# Patient Record
Sex: Male | Born: 2000 | Race: Black or African American | Hispanic: No | Marital: Single | Smoking: Current every day smoker
Health system: Southern US, Community
[De-identification: ages and names within clinical notes are randomized; demographics above are authoritative.]

## PROBLEM LIST (undated history)

## (undated) DIAGNOSIS — L509 Urticaria, unspecified: Secondary | ICD-10-CM

## (undated) HISTORY — PX: HERNIA REPAIR: SHX51

## (undated) HISTORY — DX: Urticaria, unspecified: L50.9

---
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
## 2018-04-21 ENCOUNTER — Ambulatory Visit (INDEPENDENT_AMBULATORY_CARE_PROVIDER_SITE_OTHER): Payer: Medicaid Other | Admitting: Pediatrics

## 2018-04-21 DIAGNOSIS — R109 Unspecified abdominal pain: Secondary | ICD-10-CM | POA: Diagnosis not present

## 2018-04-21 DIAGNOSIS — Z7251 High risk heterosexual behavior: Secondary | ICD-10-CM | POA: Diagnosis not present

## 2018-04-21 LAB — POCT URINALYSIS DIPSTICK
Bilirubin, UA: NEGATIVE
Blood, UA: NEGATIVE
GLUCOSE UA: NEGATIVE
Ketones, UA: POSITIVE
Nitrite, UA: NEGATIVE
Protein, UA: POSITIVE — AB
Spec Grav, UA: 1.015 (ref 1.010–1.025)
Urobilinogen, UA: NEGATIVE E.U./dL — AB
pH, UA: 7 (ref 5.0–8.0)

## 2018-04-21 NOTE — Progress Notes (Signed)
Subjective:     Patient ID: Brendan Miller, male   DOB: [birth year], 17 y.o.   MRN: [id number]  HPI  The patient is here alone for concern about having STIs. The patient states that he wants to make sure his occasional abdominal pain is not from a STI. He states that he last had sex a few weeks ago. He denies any dysuria or discharge from his penis. He states that he has had random episodes of lower abdominal pain, usually in the morning, but, it does not last for a long time.  The patient states that regarding his weight loss, he is eating a significantly less fast foods and drink less sugary drinks, more water.   Review of Systems .Review of Symptoms: General ROS: negative for - chills, fatigue, fever, night sweats ENT ROS: negative for - headaches or sore throat Respiratory ROS: no cough, shortness of breath, or wheezing Gastrointestinal ROS: negative for - change in stools, diarrhea or nausea/vomiting     Objective:   Physical Exam BP 116/72   Temp 98.4 F (36.9 C) (Temporal)   Wt 149 lb (67.6 kg)   General Appearance:  Alert, cooperative, no distress, appropriate for age                            Head:  Normocephalic, no obvious abnormality                             Eyes:  PERRL, EOM's intact, conjunctiva and corneas clear, fundi benign, both eyes                             Nose:  Nares symmetrical, septum midline, mucosa pink, clear watery discharge; no sinus tenderness                          Throat:  Lips, tongue, and mucosa are moist, pink, and intact; teeth intact                            Lungs:  Clear to auscultation bilaterally, respirations unlabored                             Heart:  Normal PMI, regular rate & rhythm, S1 and S2 normal, no murmurs, rubs, or gallops                     Abdomen:  Soft, non-tender, bowel sounds active all four quadrants, no mass, or organomegaly              Genitourinary:  Normal male, testes descended, no discharge, swelling, or  pain                   Skin/Hair/Nails:  Skin warm, dry, and intact, no rashes or abnormal dyspigmentation                      Assessment:     Abdominal pain     Plan:      .1. Abdominal pain, unspecified abdominal location - POCT urinalysis dipstick -   Ref Range & Units 11:37  Color, UA  yellow   Clarity, UA  clear   Glucose, UA  Negative Negative   Bilirubin, UA  neg   Ketones, UA  positive   Spec Grav, UA 1.010 - 1.025 1.015   Blood, UA  neg   pH, UA 5.0 - 8.0 7.0   Protein, UA Negative PositiveAbnormal    Urobilinogen, UA 0.2 or 1.0 E.U./dL negativeAbnormal    Nitrite, UA  neg   Leukocytes, UA Negative Small (1+)Abnormal    Appearance       2. High risk sexual behavior, unspecified type - GC/Chlamydia Probe Amp([facility]) - HIV antibody (with reflex); Future - HIV antibody (with reflex)    [phone] (patient's cell phone)    RTC for yearly WCC in 2 months

## 2018-04-22 LAB — GC/CHLAMYDIA PROBE AMP
CHLAMYDIA, DNA PROBE: NEGATIVE
Neisseria gonorrhoeae by PCR: NEGATIVE

## 2018-04-22 LAB — HIV ANTIBODY (ROUTINE TESTING W REFLEX): HIV Screen 4th Generation wRfx: NONREACTIVE

## 2018-04-24 ENCOUNTER — Telehealth: Payer: Self-pay | Admitting: Pediatrics

## 2018-04-24 NOTE — Telephone Encounter (Signed)
Spoke with pt, voices understanding 

## 2018-04-24 NOTE — Telephone Encounter (Signed)
Please call Brendan Miller and let him know that test results from his urine and blood are all normal.   Thank you !

## 2018-05-01 ENCOUNTER — Encounter: Payer: Self-pay | Admitting: Pediatrics

## 2018-12-15 ENCOUNTER — Telehealth: Payer: Self-pay

## 2018-12-15 ENCOUNTER — Ambulatory Visit: Payer: Medicaid Other

## 2018-12-15 NOTE — Telephone Encounter (Signed)
Called dad to let him know of pt missed apt. Dad states son wanted to cancel and will call us to reschedule. 1st no show.

## 2019-01-06 ENCOUNTER — Ambulatory Visit (INDEPENDENT_AMBULATORY_CARE_PROVIDER_SITE_OTHER): Payer: Medicaid Other | Admitting: Pediatrics

## 2019-01-06 ENCOUNTER — Other Ambulatory Visit: Payer: Self-pay

## 2019-01-06 DIAGNOSIS — Z202 Contact with and (suspected) exposure to infections with a predominantly sexual mode of transmission: Secondary | ICD-10-CM | POA: Diagnosis not present

## 2019-01-06 DIAGNOSIS — R590 Localized enlarged lymph nodes: Secondary | ICD-10-CM | POA: Diagnosis not present

## 2019-01-06 DIAGNOSIS — J029 Acute pharyngitis, unspecified: Secondary | ICD-10-CM | POA: Diagnosis not present

## 2019-01-06 LAB — POCT RAPID STREP A (OFFICE): Rapid Strep A Screen: NEGATIVE

## 2019-01-06 MED ORDER — AZITHROMYCIN 250 MG PO TABS
1000.0000 mg | ORAL_TABLET | Freq: Once | ORAL | Status: AC
  Administered 2019-01-06: 1000 mg via ORAL

## 2019-01-06 MED ORDER — AZITHROMYCIN 1 G PO PACK: 1.0000 g | PACK | Freq: Once | ORAL | Status: DC

## 2019-01-06 MED ORDER — CEFTRIAXONE SODIUM 250 MG IJ SOLR
250.0000 mg | Freq: Once | INTRAMUSCULAR | Status: AC
  Administered 2019-01-06: 250 mg via INTRAMUSCULAR

## 2019-01-06 NOTE — Progress Notes (Signed)
Brendan Miller is here today with complaint of a swollen lymph node on the left side of his neck that is getting smaller. He's had no cough, no runny nose, no congestion, ,no sore throat but his neck was sore in the area of the node. No rashes, no burning with urine, no back pain, no ankle/knee pain. His vision has not changed. He is sexually active and does not always use condoms. He has not had oral sex in 6 months.     No distress, healthy male  Left anterior lymphadenopathy no overlying erythema, non tender, and mobile.  No pharyngeal erythema, mildly enlarged tonsils. Single tonsilith on the left side.  Lungs clear Heart sounds normal, RRR   18 yo with unsafe sexual practices and enlarged cervical lymph node.  Send GC/Chlamydia, HIV, and RPR Give ceftriaxone 250 mg IM  Azithromycin 1 gm oral here today  Follow up as needed

## 2019-01-07 ENCOUNTER — Telehealth: Payer: Self-pay

## 2019-01-07 LAB — GC/CHLAMYDIA PROBE AMP
Chlamydia trachomatis, NAA: NEGATIVE
Neisseria gonorrhoeae by PCR: NEGATIVE

## 2019-01-07 LAB — SPECIMEN STATUS REPORT

## 2019-01-07 LAB — HIV-1 RNA QUANT-NO REFLEX-BLD

## 2019-01-07 LAB — RPR: RPR Ser Ql: NONREACTIVE

## 2019-01-07 NOTE — Telephone Encounter (Signed)
[phone] to call Darreld Mclean from [facility] need a call Back about the labs that was sent in wanted to know what order we needed to be done 1 or 2 because its showing a frozen plasma and it was  HIV 1 RNA viral.

## 2019-01-07 NOTE — Telephone Encounter (Signed)
Look in the lab book and call them back and tell them you just want to run the regular HIV testing. The code is in the book dude,.

## 2019-01-07 NOTE — Telephone Encounter (Signed)
So as  I was looking at book a lot of the HIV testing are saying frozen. I found one that doesn't say frozen which is called HIV-1, quallitative, RNA  ([id number]). Does that sound about right

## 2019-01-07 NOTE — Telephone Encounter (Signed)
Called Trina from [facility], gave her the correct order. Apologized for the confusion. She will add lab to the order and will also fax in something.

## 2019-01-07 NOTE — Telephone Encounter (Signed)
Yes...have them confirm. I never send in the frozen but I have a tendency to order the wrong one! Sorry about that.

## 2019-01-09 LAB — CULTURE, GROUP A STREP

## 2019-01-30 LAB — SPECIMEN STATUS REPORT

## 2019-01-30 LAB — HIV-1 RNA, QUALITATIVE, TMA: HIV-1 RNA Qualitative: NEGATIVE

## 2019-07-20 ENCOUNTER — Ambulatory Visit: Payer: Medicaid Other

## 2019-07-21 ENCOUNTER — Ambulatory Visit: Payer: Medicaid Other

## 2019-07-22 ENCOUNTER — Ambulatory Visit (INDEPENDENT_AMBULATORY_CARE_PROVIDER_SITE_OTHER): Payer: Medicaid Other | Admitting: Pediatrics

## 2019-07-22 ENCOUNTER — Other Ambulatory Visit: Payer: Self-pay

## 2019-07-22 VITALS — Temp 97.8°F | Wt 171.4 lb

## 2019-07-22 DIAGNOSIS — I889 Nonspecific lymphadenitis, unspecified: Secondary | ICD-10-CM

## 2019-07-22 DIAGNOSIS — K9041 Non-celiac gluten sensitivity: Secondary | ICD-10-CM | POA: Diagnosis not present

## 2019-07-22 MED ORDER — AMOXICILLIN-POT CLAVULANATE 875-125 MG PO TABS: 1.0000 | ORAL_TABLET | Freq: Two times a day (BID) | ORAL | 0 refills | Status: AC

## 2019-07-22 MED ORDER — AMOXICILLIN-POT CLAVULANATE 875-125 MG PO TABS: 1.0000 | ORAL_TABLET | Freq: Two times a day (BID) | ORAL | 0 refills | Status: DC

## 2019-07-22 NOTE — Patient Instructions (Signed)
Low-Gluten Eating Plan Gluten is a protein that is found in wheat, barley, rye, and triticale, a hybrid of wheat and rye. Some people have a condition that makes them unable to digest gluten. For those people, eating just a small amount of gluten can damage their intestines. This is not a gluten-free eating plan. This low-gluten eating plan is for people who feel better when they eat less gluten. What are tips for following this plan? Reading food labels Make sure to read food labels. Look for wheat, rye, barley, oats (unless it says "certified gluten-free"), malt, and brewer's yeast. If the food contains any of these, it has gluten in it. Wheat-free does not mean gluten-free. Meal planning Eat a variety of foods so you get all of the nutrients that you need. To have more control over the ingredients in your meals, consider making food yourself instead of buying prepared foods. General information Many gluten-free grain products are not fortified with vitamins and minerals like gluten-containing grains are. Because of this, there is a risk of nutrient deficiencies if you do not eat a balanced diet. Make sure to meet with your health care provider or a registered dietitian to review your diet. When eating out, look for restaurants that have gluten-free options or can make substitutions to accommodate this eating plan. Consider calling a restaurant ahead of time to discuss their menu. What foods can I eat?  With this eating plan, you can eat anything that is labeled "gluten-free" or that does not contain wheat or other grains that have gluten. Fruits All fruits, such as bananas, apples, oranges, grapes, papaya, mango, pomegranate, kiwi, grapefruit, and cherries. Vegetables All vegetables that are not in a sauce that would contain gluten, such as lettuce, spinach, peas, beets, cauliflower, cabbage, broccoli, carrots, tomatoes, squash, eggplant, herbs, peppers, onions, cucumbers, Brussels sprouts,  yams, and sweet potatoes. Grains Naturally gluten-free grains and flours, including rice, bulgur, quinoa, corn, buckwheat, cassava, amaranth, millet, polenta or cornmeal, tapioca, flax, chia, yucca, sorghum, and teff. Corn tortillas or taco shells. Oatmeal that is labeled as "gluten-free" or "uncontaminated." Nut flours. Meats and other proteins Beef. Pork. Chicken. [place]key. Fish. Eggs. Tofu. Beans. Nuts. Lentils. Dairy Milk. Ice cream. Yogurt. Cheese. Cottage cheese. Beverages Water. Coffee. Tea. Juice. Soda. Seltzer water. Distilled alcohols. Wine. Seasonings and condiments Mustard. Relish. Ketchup. Barbecue sauce. Vinegar. Mayonnaise. Tamari. Sweets and desserts Honey. Sugar. Maple syrup. Fats and oils Butter. Vegetable oil. Olive oil. Canola oil. Walnut oil. Other foods Arrowroot or cornstarch. Potato flour. The items listed above may not be a complete list of foods and beverages you can eat. Contact a dietitian for more information. What foods should I avoid? Grains Wheat. Barley. Rye. Oatmeal that is not certified gluten-free. Triticale. Meats and other proteins Seitan. Precooked or cured meat, such as sausages or meat loaves. Hot dogs. Salami. Beverages Beer, ale, lager, and malt beverages made from gluten-containing grains. Seasonings and condiments Malt vinegar. Salad dressing. Soy sauce. Teriyaki sauce. Marinades. Check the label of any pre-made sauces for a full list of ingredients. Sweets and desserts Licorice. Brown rice syrup. Pre-made pudding or pudding mixes. Other foods Bouillon cubes. Canned or boxed pre-made soups or soup packets. Bagged chips, such as potato chips and tortilla chips. Seasoning packets. Energy bars. Seasoned rice mixes. Processed foods. The items listed above may not be a complete list of foods and beverages to avoid. Contact a dietitian for more information. Summary Gluten is a protein that is found in wheat, barley, rye, and triticale,  a hybrid of  wheat and rye. This low-gluten eating plan is for people who feel better when they eat less gluten. Reading food labels of packaged foods is the best way to make sure you are following a low-gluten eating plan. Look for "gluten-free" on the label. Eat a variety of foods and colors and use whole grains that are naturally gluten-free to reduce your risk of having any nutrient deficiencies. This information is not intended to replace advice given to you by your health care provider. Make sure you discuss any questions you have with your health care provider. Document Released: 02/28/2015 Document Revised: 06/17/2018 Document Reviewed: 06/17/2018 Elsevier Patient Education  2020 [employer]. Lymphadenopathy  Lymphadenopathy means that your lymph glands are swollen or larger than normal (enlarged). Lymph glands, also called lymph nodes, are collections of tissue that filter bacteria, viruses, and waste from your bloodstream. They are part of your body's disease-fighting system (immune system), which protects your body from germs. There may be different causes of lymphadenopathy, depending on where it is in your body. Some types go away on their own. Lymphadenopathy can occur anywhere that you have lymph glands, including these areas:  Neck (cervical lymphadenopathy).  Chest (mediastinal lymphadenopathy).  Lungs (hilar lymphadenopathy).  Underarms (axillary lymphadenopathy).  Groin (inguinal lymphadenopathy). When your immune system responds to germs, infection-fighting cells and fluid build up in your lymph glands. This causes some swelling and enlargement. If the lymph glands do not go back to normal after you have an infection or disease, your health care provider may do tests. These tests help to monitor your condition and find the reason why the glands are still swollen and enlarged. Follow these instructions at home:  Get plenty of rest.  Take over-the-counter and prescription medicines  only as told by your health care provider. Your health care provider may recommend over-the-counter medicines for pain.  If directed, apply heat to swollen lymph glands as often as told by your health care provider. Use the heat source that your health care provider recommends, such as a moist heat pack or a heating pad. ? Place a towel between your skin and the heat source. ? Leave the heat on for 20-30 minutes. ? Remove the heat if your skin turns bright red. This is especially important if you are unable to feel pain, heat, or cold. You may have a greater risk of getting burned.  Check your affected lymph glands every day for changes. Check other lymph gland areas as told by your health care provider. Check for changes such as: ? More swelling. ? Sudden increase in size. ? Redness or pain. ? Hardness.  Keep all follow-up visits as told by your health care provider. This is important. Contact a health care provider if you have:  Swelling that gets worse or spreads to other areas.  Problems with breathing.  Lymph glands that: ? Are still swollen after 2 weeks. ? Have suddenly gotten bigger. ? Are red, painful, or hard.  A fever or chills.  Fatigue.  A sore throat.  Pain in your abdomen.  Weight loss.  Night sweats. Get help right away if you have:  Fluid leaking from an enlarged lymph gland.  Severe pain.  Chest pain.  Shortness of breath. Summary  Lymphadenopathy means that your lymph glands are swollen or larger than normal (enlarged).  Lymph glands (also called lymph nodes) are collections of tissue that filter bacteria, viruses, and waste from the bloodstream. They are part of your body's  disease-fighting system (immune system).  Lymphadenopathy can occur anywhere that you have lymph glands.  If your enlarged and swollen lymph glands do not go back to normal after you have an infection or disease, your health care provider may do tests to monitor your  condition and find the reason why the glands are still swollen and enlarged.  Check your affected lymph glands every day for changes. Check other lymph gland areas as told by your health care provider. This information is not intended to replace advice given to you by your health care provider. Make sure you discuss any questions you have with your health care provider. Document Released: 07/23/2008 Document Revised: 09/26/2017 Document Reviewed: 08/29/2017 Elsevier Patient Education  2020 [employer].

## 2019-07-26 NOTE — Progress Notes (Signed)
Merrit is here today with concern about a swollen submandibular lymph node. No fever, no cough, no runny nose, no tooth pain, no sore throat recently. No oral sex. He was concerned about a possible infectious disease given his history but he remembers having been tested for HIV recently and it was negative. He has not been to the dentist in more than a year.    No distress No pharyngeal erythema, no petechia on palate.  Dental caries on lower left molars and right molars. Gingiva normal.  Single mobile non tender lymph node on left side of submandibular region. No cervical or submental nodes.  S1 S2 normal intensity, RRR, no murmurs  Lungs clear    18 yo with dental caries and submandibular lymphadenopathy on left side.  I did not appreciate an abscess but I am treating him due to swelling of the lymph node. Augmentin for a week.  Recommended the dentist for him as well.  Follow up as needed

## 2019-08-10 ENCOUNTER — Ambulatory Visit (INDEPENDENT_AMBULATORY_CARE_PROVIDER_SITE_OTHER): Payer: Medicaid Other | Admitting: Pediatrics

## 2019-08-10 ENCOUNTER — Other Ambulatory Visit: Payer: Self-pay

## 2019-08-10 ENCOUNTER — Encounter: Payer: Self-pay | Admitting: Pediatrics

## 2019-08-10 VITALS — Temp 98.2°F | Wt 173.4 lb

## 2019-08-10 DIAGNOSIS — K029 Dental caries, unspecified: Secondary | ICD-10-CM

## 2019-08-10 DIAGNOSIS — I889 Nonspecific lymphadenitis, unspecified: Secondary | ICD-10-CM

## 2019-08-10 MED ORDER — AZITHROMYCIN 250 MG PO TABS: ORAL_TABLET | ORAL | 0 refills | Status: DC

## 2019-08-10 NOTE — Progress Notes (Signed)
He is here today for follow up. He did not change his toothbrush and feels that the node became smaller and is now growing again. He went to the dentist but they did not do any work because of the lymph node. He is to return next week so they can fill his cavities. No fever, no pain, no sore throat, no ear pain and no sinus drainage.      No distress  Mild swelling of the left submandibular lymph node. Mobile. No warmth or erythema of overlying skin.  No focal deficits No rash   18 yo with submandibular lymphadenopathy and multiple dental caries on lower molars.  z-pack as directed  Use listerine antiseptic mouth wash  Follow up with the dentist.

## 2019-09-22 ENCOUNTER — Ambulatory Visit (INDEPENDENT_AMBULATORY_CARE_PROVIDER_SITE_OTHER): Payer: Medicaid Other | Admitting: Pediatrics

## 2019-09-22 ENCOUNTER — Other Ambulatory Visit: Payer: Self-pay

## 2019-09-22 VITALS — Wt 174.6 lb

## 2019-09-22 DIAGNOSIS — I889 Nonspecific lymphadenitis, unspecified: Secondary | ICD-10-CM

## 2019-09-22 DIAGNOSIS — J069 Acute upper respiratory infection, unspecified: Secondary | ICD-10-CM

## 2019-09-22 NOTE — Progress Notes (Signed)
About a week ago Mikal noticed an enlarged lymph node on the right side of his neck.  A few days later he was congested and had a lot of post nasal drip.  He coughs when he is bring up mucus from his throat.  No other symptoms.  He states he can feel something in his throat when he swallows.  The lymph node is not painful and has decreased in size from when it first appeared.    On exam-  Neck there is a single palpable lymph node under the right jaw line, no other nodes palpated.   Lungs - CTA Heart - RRR with out murmur Throat - small amount of post nasal drip Ears- TM clear no fluid noted Nose - very small amount of rhinorrhea   This is a 18 year old male with a viral URI and a swollen right submandibular lymph node.     Lymph node swelling should decrease with time and as symptoms decrease.   Information on Zinc at patient request for immunity support. Please call or return to office with any further concerns.

## 2019-09-22 NOTE — Patient Instructions (Signed)
L-Lysine for the bumps on lips.  Zinc lozenges What is this medicine? ZINC (zingk) is a mineral supplement that is promoted to treat the symptoms of a cold. This supplement may be used for other purposes; ask your health care provider or pharmacist if you have questions. This medicine may be used for other purposes; ask your health care provider or pharmacist if you have questions. What should I tell my health care provider before I take this medicine? They need to know if you have any of these conditions:  too little copper in the blood  an unusual or allergic reaction to zinc, other medicines, foods, dyes, or preservatives  pregnant or trying to get pregnant  breast-feeding How should I use this medicine? Let this medicine melt in the mouth slowly and completely. Do not chew or swallow whole. Follow the directions on the package. You may take this medicine with or without food. If it upsets your stomach take it with food. Take your medicine at regular intervals. Do not take your medicine more often than directed. Talk to your pediatrician regarding the use of this medicine in children. Special care may be needed. Overdosage: If you think you have taken too much of this medicine contact a poison control center or emergency room at once. NOTE: This medicine is only for you. Do not share this medicine with others. What if I miss a dose? If you miss a dose, take it as soon as you can. If it is almost time for your next dose, take only that dose. Do not take double or extra doses. What may interact with this medicine?  certain antibiotics like quinolones or tetracyclines  citrus fruits and juices  copper supplements  edetate calcium, EDTA  iron supplements This list may not describe all possible interactions. Give your health care provider a list of all the medicines, herbs, non-prescription drugs, or dietary supplements you use. Also tell them if you smoke, drink alcohol, or use  illegal drugs. Some items may interact with your medicine. What should I watch for while using this medicine? See your doctor if your symptoms last for more than 7 days, if you get worse, or if you get a high fever. What side effects may I notice from receiving this medicine? Side effects that you should report to your doctor or health care professional as soon as possible:  allergic reactions like skin rash, itching or hives, swelling of the face, lips, or tongue  breathing problems  fever, chills, or sore throat  nausea, vomiting  stomach cramps with diarrhea  ulcers or sores in mouth that do not heal  unusually weak or tired Side effects that usually do not require medical attention (report to your doctor or health care professional if they continue or are bothersome):  bad taste in the mouth  heartburn  stomach upset This list may not describe all possible side effects. Call your doctor for medical advice about side effects. You may report side effects to FDA at 1-800-FDA-1088. Where should I keep my medicine? Keep out of the reach of children. Store at room temperature between 15 and 30 degrees C (59 and 86 degrees F). Do not freeze. Throw away any unused medicine after the expiration date. NOTE: This sheet is a summary. It may not cover all possible information. If you have questions about this medicine, talk to your doctor, pharmacist, or health care provider.  2020 Elsevier/Gold Standard (2008-07-12 14:46:33)

## 2019-10-25 ENCOUNTER — Other Ambulatory Visit: Payer: Self-pay

## 2019-10-25 ENCOUNTER — Ambulatory Visit: Payer: Medicaid Other | Attending: Internal Medicine

## 2019-10-25 DIAGNOSIS — Z20822 Contact with and (suspected) exposure to covid-19: Secondary | ICD-10-CM

## 2019-10-25 DIAGNOSIS — Z20828 Contact with and (suspected) exposure to other viral communicable diseases: Secondary | ICD-10-CM | POA: Diagnosis not present

## 2019-10-27 LAB — NOVEL CORONAVIRUS, NAA: SARS-CoV-2, NAA: NOT DETECTED

## 2020-06-19 ENCOUNTER — Ambulatory Visit: Payer: Self-pay

## 2020-06-19 DIAGNOSIS — Z113 Encounter for screening for infections with a predominantly sexual mode of transmission: Secondary | ICD-10-CM

## 2020-07-31 DIAGNOSIS — H9190 Unspecified hearing loss, unspecified ear: Secondary | ICD-10-CM | POA: Diagnosis not present

## 2020-07-31 DIAGNOSIS — H6122 Impacted cerumen, left ear: Secondary | ICD-10-CM | POA: Diagnosis not present

## 2020-07-31 DIAGNOSIS — L600 Ingrowing nail: Secondary | ICD-10-CM | POA: Diagnosis not present

## 2020-07-31 DIAGNOSIS — H6121 Impacted cerumen, right ear: Secondary | ICD-10-CM | POA: Diagnosis not present

## 2020-08-03 DIAGNOSIS — L600 Ingrowing nail: Secondary | ICD-10-CM | POA: Diagnosis not present

## 2020-08-03 DIAGNOSIS — B351 Tinea unguium: Secondary | ICD-10-CM | POA: Diagnosis not present

## 2020-10-05 ENCOUNTER — Ambulatory Visit (INDEPENDENT_AMBULATORY_CARE_PROVIDER_SITE_OTHER): Payer: Medicaid Other | Admitting: Family Medicine

## 2020-10-05 ENCOUNTER — Other Ambulatory Visit: Payer: Self-pay

## 2020-10-05 ENCOUNTER — Encounter: Payer: Self-pay | Admitting: Family Medicine

## 2020-10-05 VITALS — BP 99/48 | HR 69 | Resp 18 | Ht 70.0 in | Wt 158.2 lb

## 2020-10-05 DIAGNOSIS — Z7689 Persons encountering health services in other specified circumstances: Secondary | ICD-10-CM | POA: Insufficient documentation

## 2020-10-05 DIAGNOSIS — B351 Tinea unguium: Secondary | ICD-10-CM | POA: Insufficient documentation

## 2020-10-05 DIAGNOSIS — M954 Acquired deformity of chest and rib: Secondary | ICD-10-CM | POA: Insufficient documentation

## 2020-10-05 DIAGNOSIS — Z79899 Other long term (current) drug therapy: Secondary | ICD-10-CM | POA: Insufficient documentation

## 2020-10-05 NOTE — Assessment & Plan Note (Signed)
Will have labs drawn and based on liver enzymes will send in terbinafine x 30 days.  Right great toe nail thickened, darkened with fungal infection.  Patient in agreement with plan.

## 2020-10-05 NOTE — Progress Notes (Signed)
.   Subjective:    Patient ID: Brendan Miller, male    DOB: 2001/08/04, 19 y.o.   MRN: [id number]  Brendan Miller is a 19 y.o. male presenting on 10/05/2020 for Establish Care and [facility] (([place] chest) Pt would like to be fitted for a brace )   HPI Brendan Miller presents to clinic as a new patient for primary care.  Previous PCP was at [facility].  Records will be requested.  Past medical, family, and surgical history reviewed w/ pt.  He has acute concerns for referral to orthopedics for a chest deformity he is wanting corrected and for toenail fungus.  No flowsheet data found.  Social History   Tobacco Use  . Smoking status: Passive Smoke Exposure - Never Smoker  . Smokeless tobacco: Never Used  Substance Use Topics  . Alcohol use: No  . Drug use: No    Review of Systems  Constitutional: Negative.   HENT: Negative.   Eyes: Negative.   Respiratory: Negative.   Cardiovascular: Negative.   Gastrointestinal: Negative.   Endocrine: Negative.   Genitourinary: Negative.   Musculoskeletal: Negative.        Chest deformity  Skin: Negative.        Toenail problem  Allergic/Immunologic: Negative.   Neurological: Negative.   Hematological: Negative.   Psychiatric/Behavioral: Negative.    Per HPI unless specifically indicated above     Objective:    BP (!) 99/48 (BP Location: Right Arm, Patient Position: Sitting, Cuff Size: Normal)   Pulse 69   Resp 18   Ht _0  (1.778 m)   Wt 158 lb 3.2 oz (71.8 kg)   SpO2 100%   BMI 22.70 kg/m   Wt Readings from Last 3 Encounters:  10/05/20 158 lb 3.2 oz (71.8 kg)  09/22/19 174 lb 9.6 oz (79.2 kg)  08/10/19 173 lb 6.4 oz (78.7 kg)    Physical Exam Vitals and nursing note reviewed.  Constitutional:      General: He is not in acute distress.    Appearance: Normal appearance. He is well-developed and well-groomed. He is not ill-appearing or toxic-appearing.  HENT:     Head: Normocephalic and atraumatic.     Nose:      Comments: Brendan Miller is in place, covering mouth and nose. Eyes:     General:        Right eye: No discharge.        Left eye: No discharge.     Extraocular Movements: Extraocular movements intact.     Conjunctiva/sclera: Conjunctivae normal.     Pupils: Pupils are equal, round, and reactive to light.  Cardiovascular:     Rate and Rhythm: Normal rate and regular rhythm.     Pulses: Normal pulses.          Dorsalis pedis pulses are 2+ on the right side and 2+ on the left side.     Heart sounds: Normal heart sounds. No murmur heard. No friction rub. No gallop.   Pulmonary:     Effort: Pulmonary effort is normal. No respiratory distress.     Breath sounds: Normal breath sounds.  Musculoskeletal:        General: Deformity present.       Arms:     Right lower leg: No edema.     Left lower leg: No edema.     Comments: Area of chest rounding outward.  Feet:     Left foot:     Toenail Condition: Left  toenails are abnormally thick and long. Fungal disease present.    Comments: Left great toenail s/p ingrown toenail removal with partial removal of nail.  No s/s of infection Skin:    General: Skin is warm and dry.     Capillary Refill: Capillary refill takes less than 2 seconds.  Neurological:     General: No focal deficit present.     Mental Status: He is alert and oriented to person, place, and time.  Psychiatric:        Attention and Perception: Attention and perception normal.        Mood and Affect: Mood and affect normal.        Speech: Speech normal.        Behavior: Behavior normal. Behavior is cooperative.        Thought Content: Thought content normal.        Cognition and Memory: Cognition and memory normal.    Results for orders placed or performed in visit on 10/25/19  Novel Coronavirus, NAA ([facility])   Specimen: Nasopharyngeal(NP) swabs in vial transport medium   NASOPHARYNGE  TESTING  Result Value Ref Range   SARS-CoV-2, NAA Not Detected Not Detected       Assessment & Plan:   Problem List Items Addressed This Visit      Musculoskeletal and Integument   Toenail fungus    Will have labs drawn and based on liver enzymes will send in terbinafine x 30 days.  Right great toe nail thickened, darkened with fungal infection.  Patient in agreement with plan.        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  Patient reports he was told when he was younger that he would need to be fitted for a brace after puberty, has not met with orthopedics or any other provider to start this process.      Relevant Orders   AMB referral to orthopedics   High risk medication use    Labs to be drawn before starting terbinafine.      Relevant Orders   COMPLETE METABOLIC PANEL WITH GFR   Encounter to establish care with new doctor - Primary    New patient establishment at [facility] for primary care.  Previous PCP with [facility].  Will request copies of medical records.         No orders of the defined types were placed in this encounter.  Follow up plan: Return in about 6 months (around 04/05/2021) for CPE.   Harlin Rain, [place] Family Nurse Practitioner [facility] 10/05/2020, 4:43 PM

## 2020-10-05 NOTE — Assessment & Plan Note (Signed)
Mid sternum with ribs shaped abnormal, coming to a rounded chest.  Patient reports he was told when he was younger that he would need to be fitted for a brace after puberty, has not met with orthopedics or any other provider to start this process.

## 2020-10-05 NOTE — Patient Instructions (Addendum)
Have your labs drawn and once received, will send in your first 30 day supply of terbinafine.  It is important that we repeat your labs every 4 weeks to re-evaluate your liver function while on this medication.  A referral to Orthopedics has been placed today.  If you have not heard from the specialty office or our referral coordinator within 1 week, please let us know and we will follow up with the referral coordinator for an update.  You may find some benefit from using Debrox ear wax drops to help with ear wax accumulation.   We will plan to see you back in 6 months for your physical  You will receive a survey after today's visit either digitally by e-mail or paper by USPS mail. Your experiences and feedback matter to [place].  Please respond so we know how we are doing as we provide care for you.  Call us with any questions/concerns/needs.  It is my goal to be available to you for your health concerns.  Thanks for choosing me to be a partner in your healthcare needs!  Charlaine Dalton, FNP-C Family Nurse Practitioner [facility] Phone: [phone]

## 2020-10-05 NOTE — Assessment & Plan Note (Signed)
Labs to be drawn before starting terbinafine.

## 2020-10-05 NOTE — Assessment & Plan Note (Signed)
New patient establishment at [facility] for primary care.  Previous PCP with [facility].  Will request copies of medical records.

## 2020-11-01 ENCOUNTER — Ambulatory Visit: Payer: Medicaid Other | Admitting: Family Medicine

## 2020-11-23 ENCOUNTER — Ambulatory Visit: Payer: Medicaid Other | Admitting: Family Medicine

## 2020-11-27 ENCOUNTER — Encounter: Payer: Self-pay | Admitting: Family Medicine

## 2020-11-27 ENCOUNTER — Other Ambulatory Visit: Payer: Self-pay

## 2020-11-27 ENCOUNTER — Ambulatory Visit (INDEPENDENT_AMBULATORY_CARE_PROVIDER_SITE_OTHER): Payer: Medicaid Other | Admitting: Family Medicine

## 2020-11-27 DIAGNOSIS — Q677 Pectus carinatum: Secondary | ICD-10-CM

## 2020-11-27 NOTE — Progress Notes (Signed)
   Office Visit Note   Patient: Brendan Miller           Date of Birth: 06/13/2001           MRN: [id number] Visit Date: 11/27/2020 Requested by: Tarri Fuller, FNP No address on file PCP: Tarri Fuller, FNP  Subjective: Chief Complaint  Patient presents with  . chest    "birth deformity" - interested in "making it smaller"    HPI: He is here with chest deformity.  Since about age 20 he has noticed that his chest sticks out more than it used to.  He has occasional discomfort in it when walking.  No shortness of breath.              ROS:   All other systems were reviewed and are negative.  Objective: Vital Signs: There were no vitals taken for this visit.  Physical Exam:  General:  Alert and oriented, in no acute distress. Pulm:  Breathing unlabored. Psy:  Normal mood, congruent affect.  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   Imaging: No results found.  Assessment & Plan: 1.  Pectus carinatum -Discussed options with him, he has done some research and would like to try bracing.  If this does not help, he will contact me and I will order a CT scan of his chest followed by referral to cardiovascular thoracic surgeon if indicated/desired.     Procedures: No procedures performed        PMFS History: Patient Active Problem List   Diagnosis Date Noted  . Chest deformity 10/05/2020  . High risk medication use 10/05/2020  . Toenail fungus 10/05/2020  . Encounter to establish care with new doctor 10/05/2020   Past Medical History:  Diagnosis Date  . Hives     Family History  Problem Relation Age of Onset  . Hypertension Mother   . Aneurysm Mother   . Diabetes Other   . Hypertension Other   . Heart disease Other   . Stroke Other   . Cancer Other   . Hypertension Father   . Diabetes Maternal Grandfather   . Diabetes Paternal Grandmother   . Hypertension Paternal Grandmother   . Food Allergy Paternal Aunt      Past Surgical History:  Procedure Laterality Date  . HERNIA REPAIR     Social History   Occupational History  . Not on file  Tobacco Use  . Smoking status: Passive Smoke Exposure - Never Smoker  . Smokeless tobacco: Never Used  Substance and Sexual Activity  . Alcohol use: No  . Drug use: No  . Sexual activity: Not on file

## 2021-03-08 ENCOUNTER — Other Ambulatory Visit (HOSPITAL_COMMUNITY): Payer: Self-pay

## 2021-05-07 ENCOUNTER — Encounter: Payer: Self-pay | Admitting: Pediatrics

## 2021-06-07 ENCOUNTER — Ambulatory Visit: Payer: Self-pay | Admitting: *Deleted

## 2021-06-07 NOTE — Telephone Encounter (Addendum)
Patient called and says he would like advice on what to do for the excessive sweating until he can see the doctor on Wednesday, 06/13/21. He says he started sweating at puberty and it's more his upper body. He says he just sweats no matter if he's active or not. He was sitting in the barber chair with the cape on him and by the end his shirt was completely wet. He says he has social anxiety, so this may be the reason. Denies any other symptoms. I called the office and spoke to [place] who scheduled him on Monday, 06/11/21 at 1540 with Nicki Reaper, FNP. Patient advised, care advice given, patient verbalized understanding.   Reason for Disposition  [1] SEVERE sweating (e.g., drenched with sweat) AND [2] cause unknown  Answer Assessment - Initial Assessment Questions 1. ONSET: "When did the sweating start?"      Puberty 2. LOCATION: "What part of your body has excessive sweating?" (e.g., entire body; just face, underarms, palms, or soles of feet).      Upper body 3. SEVERITY: "How bad is the sweating?"    (Scale 1-10; or mild, moderate, severe)   -  MILD (1-3): doesn't interfere with normal activities    -  MODERATE (4-7): interferes with normal activities (e.g., work or school) or awakens from sleep; causes embarrassment in social situations    -  SEVERE (8-10): drenching sweats and has to change bed clothes or bed linens.     Severe because I could just be sitting and start sweating 4. CAUSE: "What do you think is causing the sweating?"     I'm not sure 5. FEVER: "Have you been having fevers?"     No 6. OTHER SYMPTOMS: "Do you have any other symptoms?" (e.g., chest pain, difficulty breathing, lightheadedness, weight loss)     No  Protocols used: Sweating-A-AH

## 2021-06-07 NOTE — Telephone Encounter (Signed)
Patient disconnected call before transferred to nurse triage. Attempted to call patient back- no answer- left voice mail to call office.

## 2021-06-08 NOTE — Telephone Encounter (Signed)
This sounds like a chronic problem that he says present for years.   His apt is coming up with [facility] in 5 days.  There is nothing I can prescribe at this time.  Most likely for excessive sweating if it is arm pit or axillary related he would need prescription strength Anti-Perspirant OR may ultimately end up seeing a Dermatologist they have other treatments for excessive sweating if long term problem.  Social anxiety can be related.  That is a bit more challenging to fix. It would involve behavioral strategies to help calm down anxieties and avoid situational triggers or overcome them to not have the sweating reaction.  Saralyn Pilar, DO [facility] 06/08/2021, 1:07 PM

## 2021-06-08 NOTE — Telephone Encounter (Signed)
Attempted to call pt, no answer. Left message to return call or option to hold off until appt with [facility].

## 2021-06-11 ENCOUNTER — Ambulatory Visit: Payer: Medicaid Other | Admitting: Internal Medicine

## 2021-06-11 NOTE — Progress Notes (Deleted)
Subjective:    Patient ID: Brendan Miller, male    DOB: 02-17-2001, 20 y.o.   MRN: [id number]  HPI   Pt presents to the clinic today with c/o sweating.  Review of Systems     Past Medical History:  Diagnosis Date   Hives     No current outpatient medications on file.   No current facility-administered medications for this visit.    Allergies  Allergen Reactions   Wheat Bran Hives    Family History  Problem Relation Age of Onset   Hypertension Mother    Aneurysm Mother    Diabetes Other    Hypertension Other    Heart disease Other    Stroke Other    Cancer Other    Hypertension Father    Diabetes Maternal Grandfather    Diabetes Paternal Grandmother    Hypertension Paternal Grandmother    Food Allergy Paternal Aunt     Social History   Socioeconomic History   Marital status: Single    Spouse name: Not on file   Number of children: Not on file   Years of education: Not on file   Highest education level: Not on file  Occupational History   Not on file  Tobacco Use   Smoking status: Passive Smoke Exposure - Never Smoker   Smokeless tobacco: Never  Substance and Sexual Activity   Alcohol use: No   Drug use: No   Sexual activity: Not on file  Other Topics Concern   Not on file  Social History Narrative   Wants to graduate in Dec 2018       Wants to start a group home    Social Determinants of Health   Financial Resource Strain: Not on file  Food Insecurity: Not on file  Transportation Needs: Not on file  Physical Activity: Not on file  Stress: Not on file  Social Connections: Not on file  Intimate Partner Violence: Not on file     Constitutional: Denies fever, malaise, fatigue, headache or abrupt weight changes.  HEENT: Denies eye pain, eye redness, ear pain, ringing in the ears, wax buildup, runny nose, nasal congestion, bloody nose, or sore throat. Respiratory: Denies difficulty breathing, shortness of breath, cough or sputum  production.   Cardiovascular: Denies chest pain, chest tightness, palpitations or swelling in the hands or feet.  Gastrointestinal: Denies abdominal pain, bloating, constipation, diarrhea or blood in the stool.  GU: Denies urgency, frequency, pain with urination, burning sensation, blood in urine, odor or discharge. Musculoskeletal: Denies decrease in range of motion, difficulty with gait, muscle pain or joint pain and swelling.  Skin: Denies redness, rashes, lesions or ulcercations.  Neurological: Denies dizziness, difficulty with memory, difficulty with speech or problems with balance and coordination.  Psych: Denies anxiety, depression, SI/HI.  No other specific complaints in a complete review of systems (except as listed in HPI above).  Objective:   Physical Exam   There were no vitals taken for this visit. Wt Readings from Last 3 Encounters:  10/05/20 158 lb 3.2 oz (71.8 kg)  09/22/19 174 lb 9.6 oz (79.2 kg)  08/10/19 173 lb 6.4 oz (78.7 kg)    General: Appears their stated age, well developed, well nourished in NAD. Skin: Warm, dry and intact. No rashes, lesions or ulcerations noted. HEENT: Head: normal shape and size; Eyes: sclera white, no icterus, conjunctiva pink, PERRLA and EOMs intact; Ears: Tm's gray and intact, normal light reflex; Nose: mucosa pink and moist, septum midline;  Throat/Mouth: Teeth present, mucosa pink and moist, no exudate, lesions or ulcerations noted.  Neck:  Neck supple, trachea midline. No masses, lumps or thyromegaly present.  Cardiovascular: Normal rate and rhythm. S1,S2 noted.  No murmur, rubs or gallops noted. No JVD or BLE edema. No carotid bruits noted. Pulmonary/Chest: Normal effort and positive vesicular breath sounds. No respiratory distress. No wheezes, rales or ronchi noted.  Abdomen: Soft and nontender. Normal bowel sounds. No distention or masses noted. Liver, spleen and kidneys non palpable. Musculoskeletal: Normal range of motion. No signs  of joint swelling. No difficulty with gait.  Neurological: Alert and oriented. Cranial nerves II-XII grossly intact. Coordination normal.  Psychiatric: Mood and affect normal. Behavior is normal. Judgment and thought content normal.   EKG:  BMET    Component Value Date/Time   NA 139 08/24/2014 1219   K 4.2 08/24/2014 1219   CL 100 08/24/2014 1219   CO2 28 08/24/2014 1219   GLUCOSE 96 08/24/2014 1219   BUN 6 08/24/2014 1219   CREATININE 0.84 08/24/2014 1219   CALCIUM 9.7 08/24/2014 1219   GFRNONAA NOT CALCULATED 08/24/2014 1219   GFRAA NOT CALCULATED 08/24/2014 1219    Lipid Panel  No results found for: CHOL, TRIG, HDL, CHOLHDL, VLDL, LDLCALC  CBC    Component Value Date/Time   WBC 5.1 08/24/2014 1219   RBC 4.67 08/24/2014 1219   HGB 14.3 08/24/2014 1219   HCT 40.5 08/24/2014 1219   PLT 234 08/24/2014 1219   MCV 86.7 08/24/2014 1219   MCH 30.6 08/24/2014 1219   MCHC 35.3 08/24/2014 1219   RDW 12.2 08/24/2014 1219   LYMPHSABS 2.0 08/24/2014 1219   MONOABS 0.4 08/24/2014 1219   EOSABS 0.3 08/24/2014 1219   BASOSABS 0.0 08/24/2014 1219    Hgb A1C No results found for: HGBA1C         Assessment & Plan:   Nicki Reaper, NP This visit occurred during the SARS-CoV-2 public health emergency.  Safety protocols were in place, including screening questions prior to the visit, additional usage of staff PPE, and extensive cleaning of exam room while observing appropriate contact time as indicated for disinfecting solutions.

## 2021-06-13 ENCOUNTER — Ambulatory Visit: Payer: Medicaid Other | Admitting: Internal Medicine

## 2021-07-27 DIAGNOSIS — Z113 Encounter for screening for infections with a predominantly sexual mode of transmission: Secondary | ICD-10-CM | POA: Diagnosis not present

## 2021-07-27 DIAGNOSIS — R351 Nocturia: Secondary | ICD-10-CM | POA: Diagnosis not present

## 2022-02-11 ENCOUNTER — Ambulatory Visit
Admission: EM | Admit: 2022-02-11 | Discharge: 2022-02-11 | Disposition: A | Discharge status: Home / Self Care | Payer: Medicaid Other | Attending: Emergency Medicine | Admitting: Emergency Medicine

## 2022-02-11 ENCOUNTER — Encounter: Payer: Self-pay | Admitting: Emergency Medicine

## 2022-02-11 DIAGNOSIS — Z113 Encounter for screening for infections with a predominantly sexual mode of transmission: Secondary | ICD-10-CM | POA: Insufficient documentation

## 2022-02-11 DIAGNOSIS — B351 Tinea unguium: Secondary | ICD-10-CM

## 2022-02-11 NOTE — ED Provider Notes (Signed)
?[facility] ? ? ? ?CSN: [id number] ?Arrival date & time: 02/11/22  1046 ? ? ?  ? ?History   ?Chief Complaint ?Chief Complaint  ?Patient presents with  ? STD Testing   ? Nail Problem  ? ? ?HPI ?Brendan Miller is a 21 y.o. male.  Patient presents for STD screening.  He is sexually active.  He denies penile discharge, rash, lesions, abdominal pain, dysuria, hematuria, testicular pain.  Patient also reports right great toe nail fungus x1 month.  He states he was seen at another urgent care and prescribed a pill but did not take it.  He denies other symptoms.  He denies pertinent medical history. ? ?The history is provided by the patient.  ? ?Past Medical History:  ?Diagnosis Date  ? Hives   ? ? ?There are no problems to display for this patient. ? ? ?Past Surgical History:  ?Procedure Laterality Date  ? HERNIA REPAIR    ? ? ? ? ? ?Home Medications   ? ?Prior to Admission medications   ?Not on File  ? ? ?Family History ?Family History  ?Problem Relation Age of Onset  ? Hypertension Mother   ? Aneurysm Mother   ? Diabetes Other   ? Hypertension Other   ? Heart disease Other   ? Stroke Other   ? Cancer Other   ? Hypertension Father   ? Diabetes Maternal Grandfather   ? Diabetes Paternal Grandmother   ? Hypertension Paternal Grandmother   ? Food Allergy Paternal Aunt   ? ? ?Social History ?Social History  ? ?Tobacco Use  ? Smoking status: Never  ?  Passive exposure: Yes  ? Smokeless tobacco: Never  ?Vaping Use  ? Vaping Use: Every day  ?Substance Use Topics  ? Alcohol use: No  ? Drug use: No  ? ? ? ?Allergies   ?Wheat bran ? ? ?Review of Systems ?Review of Systems  ?Constitutional:  Negative for chills and fever.  ?Gastrointestinal:  Negative for abdominal pain, nausea and vomiting.  ?Genitourinary:  Negative for dysuria, flank pain, hematuria, penile discharge and testicular pain.  ?Musculoskeletal:  Negative for arthralgias, gait problem and joint swelling.  ?Skin:  Negative for color change and rash.  ?      Toenail fungus on right great toe.  ?All other systems reviewed and are negative. ? ? ?Physical Exam ?Triage Vital Signs ?ED Triage Vitals  ?Enc Vitals Group  ?   BP   ?   Pulse   ?   Resp   ?   Temp   ?   Temp src   ?   SpO2   ?   Weight   ?   Height   ?   Head Circumference   ?   Peak Flow   ?   Pain Score   ?   Pain Loc   ?   Pain Edu?   ?   Excl. in GC?   ? ?No data found. ? ?Updated Vital Signs ?BP 135/78   Pulse 70   Temp 98.1 ?F (36.7 ?C)   Resp 18   SpO2 97%  ? ?Visual Acuity ?Right Eye Distance:   ?Left Eye Distance:   ?Bilateral Distance:   ? ?Right Eye Near:   ?Left Eye Near:    ?Bilateral Near:    ? ?Physical Exam ?Vitals and nursing note reviewed.  ?Constitutional:   ?   General: He is not in acute distress. ?   Appearance:  Normal appearance. He is well-developed. He is not ill-appearing.  ?HENT:  ?   Mouth/Throat:  ?   Mouth: Mucous membranes are moist.  ?Cardiovascular:  ?   Rate and Rhythm: Normal rate and regular rhythm.  ?   Heart sounds: Normal heart sounds.  ?Pulmonary:  ?   Effort: Pulmonary effort is normal. No respiratory distress.  ?   Breath sounds: Normal breath sounds.  ?Abdominal:  ?   Palpations: Abdomen is soft.  ?   Tenderness: There is no abdominal tenderness.  ?Musculoskeletal:     ?   General: No swelling. Normal range of motion.  ?   Cervical back: Neck supple.  ?Skin: ?   General: Skin is warm and dry.  ?   Capillary Refill: Capillary refill takes less than 2 seconds.  ?   Comments: Toenail on right great toe is thick, yellowish from base to tip.  ?Neurological:  ?   Mental Status: He is alert.  ?   Gait: Gait normal.  ?Psychiatric:     ?   Mood and Affect: Mood normal.     ?   Behavior: Behavior normal.  ? ? ? ?UC Treatments / Results  ?Labs ?(all labs ordered are listed, but only abnormal results are displayed) ?Labs Reviewed  ?RPR  ?HIV ANTIBODY (ROUTINE TESTING W REFLEX)  ?CYTOLOGY, (ORAL, ANAL, URETHRAL) ANCILLARY ONLY  ? ? ?EKG ? ? ?Radiology ?No results  found. ? ?Procedures ?Procedures (including critical care time) ? ?Medications Ordered in UC ?Medications - No data to display ? ?Initial Impression / Assessment and Plan / UC Course  ?I have reviewed the triage vital signs and the nursing notes. ? ?Pertinent labs & imaging results that were available during my care of the patient were reviewed by me and considered in my medical decision making (see chart for details). ? ?  ?STD screening.  Right great toe onychomycosis.  Patient obtained urethral self swab for STD testing.  Blood obtained for HIV and syphilis testing.  Discussed that we will call if test results are positive.  Discussed that he may require treatment at that time.  Instructed patient to abstain from sexual activity for at least 7 days.  Instructed patient to see a podiatrist for his toenail fungus.  Education provided on onychomycosis.  Patient agrees to plan of care.  ? ?Final Clinical Impressions(s) / UC Diagnoses  ? ?Final diagnoses:  ?Screening for STD (sexually transmitted disease)  ?Onychomycosis of great toe  ? ? ? ?Discharge Instructions   ? ?  ?Your STD tests are pending.  If your test results are positive, we will call you.  You and your sexual partner(s) may require treatment at that time.  Do not have sexual activity for at least 7 days.   ? ?See a podiatrist for your toenail fungal infection.  ? ? ? ? ? ? ? ? ?ED Prescriptions   ?None ?  ? ?PDMP not reviewed this encounter. ?  ?Mickie Bail, NP ?02/11/22 1151 ? ?

## 2022-02-11 NOTE — ED Triage Notes (Signed)
Pt presents for STD he denies any symptoms. He also has fungus on his right big toe x 1 month ?

## 2022-02-11 NOTE — Discharge Instructions (Addendum)
Your STD tests are pending.  If your test results are positive, we will call you.  You and your sexual partner(s) may require treatment at that time.  Do not have sexual activity for at least 7 days.   ? ?See a podiatrist for your toenail fungal infection.  ? ? ? ? ?

## 2022-02-12 LAB — CYTOLOGY, (ORAL, ANAL, URETHRAL) ANCILLARY ONLY
Chlamydia: NEGATIVE
Comment: NEGATIVE
Comment: NEGATIVE
Comment: NORMAL
Neisseria Gonorrhea: NEGATIVE
Trichomonas: NEGATIVE

## 2022-02-12 LAB — HIV ANTIBODY (ROUTINE TESTING W REFLEX): HIV Screen 4th Generation wRfx: NONREACTIVE

## 2022-02-12 LAB — RPR: RPR Ser Ql: NONREACTIVE

## 2022-02-28 ENCOUNTER — Encounter: Payer: Self-pay | Admitting: *Deleted

## 2022-03-29 ENCOUNTER — Ambulatory Visit: Payer: Medicaid Other | Admitting: Podiatry

## 2022-03-29 DIAGNOSIS — L600 Ingrowing nail: Secondary | ICD-10-CM | POA: Diagnosis not present

## 2022-03-29 DIAGNOSIS — B351 Tinea unguium: Secondary | ICD-10-CM | POA: Diagnosis not present

## 2022-03-29 DIAGNOSIS — M79674 Pain in right toe(s): Secondary | ICD-10-CM | POA: Diagnosis not present

## 2022-03-29 MED ORDER — TERBINAFINE HCL 250 MG PO TABS: 250.0000 mg | ORAL_TABLET | Freq: Every day | ORAL | 0 refills | Status: AC

## 2022-03-29 MED ORDER — CLOTRIMAZOLE-BETAMETHASONE 1-0.05 % EX CREA: 1.0000 "application " | TOPICAL_CREAM | Freq: Two times a day (BID) | CUTANEOUS | 1 refills | Status: DC

## 2022-04-09 NOTE — Progress Notes (Signed)
   Subjective: 21 y.o. male presenting today as a new patient for evaluation of thick discoloration and pain associated to the right hallux nail plate.  Patient is concerned for fungal nail infection.  He says that his right toenail is very painful and is been like this for the last 2-3 years.  It is slowly become more thick and discolored with time.  He is unable to wear shoes without pain.  Past Medical History:  Diagnosis Date   Hives    Past Surgical History:  Procedure Laterality Date   HERNIA REPAIR     Allergies  Allergen Reactions   Wheat Bran Hives     Objective: Physical Exam General: The patient is alert and oriented x3 in no acute distress.  Dermatology: Hyperkeratotic, discolored, thickened, onychodystrophy noted to the right hallux nail plate with associated tenderness to palpation. Skin is warm, dry and supple bilateral lower extremities. Negative for open lesions or macerations. There is also some evidence of an ingrowing toenail to the medial border of the left hallux nail plate.  Vascular: Palpable pedal pulses bilaterally. No edema or erythema noted. Capillary refill within normal limits.  Neurological: Epicritic and protective threshold grossly intact bilaterally.   Musculoskeletal Exam: No pedal deformity noted  Assessment: #1 Onychomycosis of toenail right hallux nail plate with associated tenderness #2 ingrowing toenail medial border left great toe #3 tinea pedis bilateral  Plan of Care:  #1 Patient was evaluated. #2  Today we discussed different treatment options including oral, topical, and laser antifungal treatment modalities.  We discussed their efficacies and side effects.  Patient opts for oral antifungal treatment modality #3 prescription for Lamisil 250 mg #90 daily.  he denies a history of liver pathology or symptoms.  Patient is otherwise healthy #4  Prescription for Lotrisone cream apply daily to the interdigital areas of the toes #5 after  evaluating the toenail today I do believe it may be in the patient's best interest to totally remove the right hallux nail plate and allow the new nail to grow when.  We discussed this option with the patient and he agrees.  The toe was prepped in aseptic manner and digital block performed using 3 mils 2% lidocaine plain.  The nail was avulsed in its entirety and light dressing applied.  Post care instructions provided #6 return to clinic in 3 weeks for possible partial nail matricectomy to the medial border of the left great toe   Felecia Shelling, DPM [facility]  Dr. Felecia Shelling, DPM    [address] [place], [place] [address]                Office [phone]  Fax [phone]

## 2022-04-11 ENCOUNTER — Ambulatory Visit: Payer: Medicaid Other | Admitting: Internal Medicine

## 2022-04-11 NOTE — Progress Notes (Deleted)
Subjective:    Patient ID: Brendan Miller, male    DOB: 2001/03/18, 21 y.o.   MRN: [id number]  HPI  Patient presents to clinic today for his annual exam.  Flu: Tetanus: 05/2011 COVID: Dentist:  Diet: Exercise:  Review of Systems     Past Medical History:  Diagnosis Date   Hives     Current Outpatient Medications  Medication Sig Dispense Refill   clotrimazole-betamethasone (LOTRISONE) cream Apply 1 application. topically 2 (two) times daily. 45 g 1   terbinafine (LAMISIL) 250 MG tablet Take 1 tablet (250 mg total) by mouth daily. 90 tablet 0   No current facility-administered medications for this visit.    Allergies  Allergen Reactions   Wheat Bran Hives    Family History  Problem Relation Age of Onset   Hypertension Mother    Aneurysm Mother    Diabetes Other    Hypertension Other    Heart disease Other    Stroke Other    Cancer Other    Hypertension Father    Diabetes Maternal Grandfather    Diabetes Paternal Grandmother    Hypertension Paternal Grandmother    Food Allergy Paternal Aunt     Social History   Socioeconomic History   Marital status: Single    Spouse name: Not on file   Number of children: Not on file   Years of education: Not on file   Highest education level: Not on file  Occupational History   Not on file  Tobacco Use   Smoking status: Never    Passive exposure: Yes   Smokeless tobacco: Never  Vaping Use   Vaping Use: Every day  Substance and Sexual Activity   Alcohol use: No   Drug use: No   Sexual activity: Not on file  Other Topics Concern   Not on file  Social History Narrative   Wants to graduate in Dec 2018       Wants to start a group home    Social Determinants of Health   Financial Resource Strain: Not on file  Food Insecurity: Not on file  Transportation Needs: Not on file  Physical Activity: Not on file  Stress: Not on file  Social Connections: Not on file  Intimate Partner Violence: Not on file      Constitutional: Denies fever, malaise, fatigue, headache or abrupt weight changes.  HEENT: Denies eye pain, eye redness, ear pain, ringing in the ears, wax buildup, runny nose, nasal congestion, bloody nose, or sore throat. Respiratory: Denies difficulty breathing, shortness of breath, cough or sputum production.   Cardiovascular: Denies chest pain, chest tightness, palpitations or swelling in the hands or feet.  Gastrointestinal: Denies abdominal pain, bloating, constipation, diarrhea or blood in the stool.  GU: Denies urgency, frequency, pain with urination, burning sensation, blood in urine, odor or discharge. Musculoskeletal: Denies decrease in range of motion, difficulty with gait, muscle pain or joint pain and swelling.  Skin: Patient reports toenail fungus.  Denies redness, rashes, lesions or ulcercations.  Neurological: Denies dizziness, difficulty with memory, difficulty with speech or problems with balance and coordination.  Psych: Denies anxiety, depression, SI/HI.  No other specific complaints in a complete review of systems (except as listed in HPI above).  Objective:   Physical Exam  There were no vitals taken for this visit. Wt Readings from Last 3 Encounters:  10/05/20 158 lb 3.2 oz (71.8 kg)  09/22/19 174 lb 9.6 oz (79.2 kg)  08/10/19 173 lb 6.4 oz (  78.7 kg)    General: Appears their stated age, well developed, well nourished in NAD. Skin: Warm, dry and intact. No rashes, lesions or ulcerations noted. HEENT: Head: normal shape and size; Eyes: sclera white, no icterus, conjunctiva pink, PERRLA and EOMs intact; Ears: Tm's gray and intact, normal light reflex; Nose: mucosa pink and moist, septum midline; Throat/Mouth: Teeth present, mucosa pink and moist, no exudate, lesions or ulcerations noted.  Neck:  Neck supple, trachea midline. No masses, lumps or thyromegaly present.  Cardiovascular: Normal rate and rhythm. S1,S2 noted.  No murmur, rubs or gallops noted. No JVD  or BLE edema. No carotid bruits noted. Pulmonary/Chest: Normal effort and positive vesicular breath sounds. No respiratory distress. No wheezes, rales or ronchi noted.  Abdomen: Soft and nontender. Normal bowel sounds. No distention or masses noted. Liver, spleen and kidneys non palpable. Musculoskeletal: Normal range of motion. No signs of joint swelling. No difficulty with gait.  Neurological: Alert and oriented. Cranial nerves II-XII grossly intact. Coordination normal.  Psychiatric: Mood and affect normal. Behavior is normal. Judgment and thought content normal.    BMET    Component Value Date/Time   NA 139 08/24/2014 1219   K 4.2 08/24/2014 1219   CL 100 08/24/2014 1219   CO2 28 08/24/2014 1219   GLUCOSE 96 08/24/2014 1219   BUN 6 08/24/2014 1219   CREATININE 0.84 08/24/2014 1219   CALCIUM 9.7 08/24/2014 1219   GFRNONAA NOT CALCULATED 08/24/2014 1219   GFRAA NOT CALCULATED 08/24/2014 1219    Lipid Panel  No results found for: "CHOL", "TRIG", "HDL", "CHOLHDL", "VLDL", "LDLCALC"  CBC    Component Value Date/Time   WBC 5.1 08/24/2014 1219   RBC 4.67 08/24/2014 1219   HGB 14.3 08/24/2014 1219   HCT 40.5 08/24/2014 1219   PLT 234 08/24/2014 1219   MCV 86.7 08/24/2014 1219   MCH 30.6 08/24/2014 1219   MCHC 35.3 08/24/2014 1219   RDW 12.2 08/24/2014 1219   LYMPHSABS 2.0 08/24/2014 1219   MONOABS 0.4 08/24/2014 1219   EOSABS 0.3 08/24/2014 1219   BASOSABS 0.0 08/24/2014 1219    Hgb A1C No results found for: "HGBA1C"          Assessment & Plan:   Preventative Health Maintenance:  Encouraged him to get a flu shot in fall Tdap today Encouraged him to get a COVID-vaccine Encouraged him to consume a balanced diet and exercise regimen Advised him to see a dentist today We will check CBC, c-Met, lipid and hep C today  RTC in 1 year for your annual exam Webb Silversmith, NP

## 2022-04-16 ENCOUNTER — Ambulatory Visit: Payer: Medicaid Other | Admitting: Internal Medicine

## 2022-04-16 NOTE — Progress Notes (Deleted)
Subjective:    Patient ID: Brendan Miller, male    DOB: [birth year], 21 y.o.   MRN: [id number]  HPI  Patient presents to clinic today for his annual exam.  Flu: Tetanus: COVID: Dentist:  Diet: Exercise:  Review of Systems     Past Medical History:  Diagnosis Date   Hives     Current Outpatient Medications  Medication Sig Dispense Refill   clotrimazole-betamethasone (LOTRISONE) cream Apply 1 application. topically 2 (two) times daily. 45 g 1   terbinafine (LAMISIL) 250 MG tablet Take 1 tablet (250 mg total) by mouth daily. 90 tablet 0   No current facility-administered medications for this visit.    Allergies  Allergen Reactions   Wheat Bran Hives    Family History  Problem Relation Age of Onset   Hypertension Mother    Aneurysm Mother    Diabetes Other    Hypertension Other    Heart disease Other    Stroke Other    Cancer Other    Hypertension Father    Diabetes Maternal Grandfather    Diabetes Paternal Grandmother    Hypertension Paternal Grandmother    Food Allergy Paternal Aunt     Social History   Socioeconomic History   Marital status: Single    Spouse name: Not on file   Number of children: Not on file   Years of education: Not on file   Highest education level: Not on file  Occupational History   Not on file  Tobacco Use   Smoking status: Never    Passive exposure: Yes   Smokeless tobacco: Never  Vaping Use   Vaping Use: Every day  Substance and Sexual Activity   Alcohol use: No   Drug use: No   Sexual activity: Not on file  Other Topics Concern   Not on file  Social History Narrative   Wants to graduate in Dec 2018       Wants to start a group home    Social Determinants of Health   Financial Resource Strain: Not on file  Food Insecurity: Not on file  Transportation Needs: Not on file  Physical Activity: Not on file  Stress: Not on file  Social Connections: Not on file  Intimate Partner Violence: Not on file      Constitutional: Denies fever, malaise, fatigue, headache or abrupt weight changes.  HEENT: Denies eye pain, eye redness, ear pain, ringing in the ears, wax buildup, runny nose, nasal congestion, bloody nose, or sore throat. Respiratory: Denies difficulty breathing, shortness of breath, cough or sputum production.   Cardiovascular: Denies chest pain, chest tightness, palpitations or swelling in the hands or feet.  Gastrointestinal: Denies abdominal pain, bloating, constipation, diarrhea or blood in the stool.  GU: Denies urgency, frequency, pain with urination, burning sensation, blood in urine, odor or discharge. Musculoskeletal: Denies decrease in range of motion, difficulty with gait, muscle pain or joint pain and swelling.  Skin: Patient reports fungal toenails.  Denies redness, rashes, lesions or ulcercations.  Neurological: Denies dizziness, difficulty with memory, difficulty with speech or problems with balance and coordination.  Psych: Denies anxiety, depression, SI/HI.  No other specific complaints in a complete review of systems (except as listed in HPI above).  Objective:   Physical Exam  There were no vitals taken for this visit. Wt Readings from Last 3 Encounters:  10/05/20 158 lb 3.2 oz (71.8 kg)  09/22/19 174 lb 9.6 oz (79.2 kg)  08/10/19 173 lb 6.4 oz (78.7  kg)    General: Appears their stated age, well developed, well nourished in NAD. Skin: Warm, dry and intact. No rashes, lesions or ulcerations noted. HEENT: Head: normal shape and size; Eyes: sclera white, no icterus, conjunctiva pink, PERRLA and EOMs intact; Ears: Tm's gray and intact, normal light reflex; Nose: mucosa pink and moist, septum midline; Throat/Mouth: Teeth present, mucosa pink and moist, no exudate, lesions or ulcerations noted.  Neck:  Neck supple, trachea midline. No masses, lumps or thyromegaly present.  Cardiovascular: Normal rate and rhythm. S1,S2 noted.  No murmur, rubs or gallops noted. No  JVD or BLE edema. No carotid bruits noted. Pulmonary/Chest: Normal effort and positive vesicular breath sounds. No respiratory distress. No wheezes, rales or ronchi noted.  Abdomen: Soft and nontender. Normal bowel sounds. No distention or masses noted. Liver, spleen and kidneys non palpable. Musculoskeletal: Normal range of motion. No signs of joint swelling. No difficulty with gait.  Neurological: Alert and oriented. Cranial nerves II-XII grossly intact. Coordination normal.  Psychiatric: Mood and affect normal. Behavior is normal. Judgment and thought content normal.    BMET    Component Value Date/Time   NA 139 08/24/2014 1219   K 4.2 08/24/2014 1219   CL 100 08/24/2014 1219   CO2 28 08/24/2014 1219   GLUCOSE 96 08/24/2014 1219   BUN 6 08/24/2014 1219   CREATININE 0.84 08/24/2014 1219   CALCIUM 9.7 08/24/2014 1219   GFRNONAA NOT CALCULATED 08/24/2014 1219   GFRAA NOT CALCULATED 08/24/2014 1219    Lipid Panel  No results found for: "CHOL", "TRIG", "HDL", "CHOLHDL", "VLDL", "LDLCALC"  CBC    Component Value Date/Time   WBC 5.1 08/24/2014 1219   RBC 4.67 08/24/2014 1219   HGB 14.3 08/24/2014 1219   HCT 40.5 08/24/2014 1219   PLT 234 08/24/2014 1219   MCV 86.7 08/24/2014 1219   MCH 30.6 08/24/2014 1219   MCHC 35.3 08/24/2014 1219   RDW 12.2 08/24/2014 1219   LYMPHSABS 2.0 08/24/2014 1219   MONOABS 0.4 08/24/2014 1219   EOSABS 0.3 08/24/2014 1219   BASOSABS 0.0 08/24/2014 1219    Hgb A1C No results found for: "HGBA1C"          Assessment & Plan:   Preventative Health Maintenance:  Encouraged him to get a flu shot in the fall Tetanus today Encouraged him to get his COVID-vaccine Encouraged him to consume a balanced diet and exercise regimen Advised him to see an eye doctor and dentist annually We will check CBC, c-Met, lipid and hep C today  RTC in 1 year, sooner if needed Webb Silversmith, NP

## 2022-04-19 ENCOUNTER — Ambulatory Visit: Payer: Medicaid Other | Admitting: Podiatry

## 2022-04-19 DIAGNOSIS — M79674 Pain in right toe(s): Secondary | ICD-10-CM | POA: Diagnosis not present

## 2022-04-19 DIAGNOSIS — B351 Tinea unguium: Secondary | ICD-10-CM | POA: Diagnosis not present

## 2022-05-13 ENCOUNTER — Ambulatory Visit
Admission: EM | Admit: 2022-05-13 | Discharge: 2022-05-13 | Disposition: A | Discharge status: Home / Self Care | Payer: Medicaid Other | Attending: Emergency Medicine | Admitting: Emergency Medicine

## 2022-05-13 ENCOUNTER — Encounter: Payer: Self-pay | Admitting: Emergency Medicine

## 2022-05-13 DIAGNOSIS — R369 Urethral discharge, unspecified: Secondary | ICD-10-CM | POA: Insufficient documentation

## 2022-05-13 MED ORDER — CEFTRIAXONE SODIUM 500 MG IJ SOLR
500.0000 mg | Freq: Once | INTRAMUSCULAR | Status: AC
  Administered 2022-05-13: 500 mg via INTRAMUSCULAR

## 2022-05-13 NOTE — ED Provider Notes (Signed)
Brendan Miller    CSN: [id number] Arrival date & time: 05/13/22  1018      History   Chief Complaint Chief Complaint  Patient presents with   Penile Discharge    HPI Brendan Miller is a 21 y.o. male.   Patient presents with copious yellow penile discharge and dysuria beginning 1 day ago.  Denies urinary frequency, urgency, fever, chills, new rash or lesions, penile or testicle swelling.  Sexually active, 3 partners within the last 2 months, sometimes condom use, no known exposure.   Past Medical History:  Diagnosis Date   Hives     There are no problems to display for this patient.   Past Surgical History:  Procedure Laterality Date   HERNIA REPAIR         Home Medications    Prior to Admission medications   Medication Sig Start Date End Date Taking? Authorizing Provider  terbinafine (LAMISIL) 250 MG tablet Take 1 tablet (250 mg total) by mouth daily. 03/29/22  Yes Felecia Shelling, DPM  clotrimazole-betamethasone (LOTRISONE) cream Apply 1 application. topically 2 (two) times daily. 03/29/22   Felecia Shelling, DPM    Family History Family History  Problem Relation Age of Onset   Hypertension Mother    Aneurysm Mother    Diabetes Other    Hypertension Other    Heart disease Other    Stroke Other    Cancer Other    Hypertension Father    Diabetes Maternal Grandfather    Diabetes Paternal Grandmother    Hypertension Paternal Grandmother    Food Allergy Paternal Aunt     Social History Social History   Tobacco Use   Smoking status: Never    Passive exposure: Yes   Smokeless tobacco: Never  Vaping Use   Vaping Use: Every day  Substance Use Topics   Alcohol use: No   Drug use: No     Allergies   Wheat bran   Review of Systems Review of Systems  Constitutional: Negative.   Respiratory: Negative.    Cardiovascular: Negative.   Genitourinary:  Positive for frequency and penile discharge. Negative for decreased urine volume, difficulty  urinating, dysuria, enuresis, flank pain, genital sores, hematuria, penile pain, penile swelling, scrotal swelling, testicular pain and urgency.  Skin: Negative.   Neurological: Negative.      Physical Exam Triage Vital Signs ED Triage Vitals  Enc Vitals Group     BP 05/13/22 1032 104/68     Pulse Rate 05/13/22 1032 73     Resp 05/13/22 1032 18     Temp 05/13/22 1032 99.1 F (37.3 C)     Temp Source 05/13/22 1032 Oral     SpO2 05/13/22 1032 96 %     Weight --      Height --      Head Circumference --      Peak Flow --      Pain Score 05/13/22 1024 1     Pain Loc --      Pain Edu? --      Excl. in GC? --    No data found.  Updated Vital Signs BP 104/68 (BP Location: Left Arm)   Pulse 73   Temp 99.1 F (37.3 C) (Oral)   Resp 18   SpO2 96%   Visual Acuity Right Eye Distance:   Left Eye Distance:   Bilateral Distance:    Right Eye Near:   Left Eye Near:    Bilateral  Near:     Physical Exam Constitutional:      Appearance: Normal appearance.  Eyes:     Extraocular Movements: Extraocular movements intact.  Pulmonary:     Effort: Pulmonary effort is normal.  Genitourinary:    Comments: deferred Neurological:     Mental Status: He is alert and oriented to person, place, and time. Mental status is at baseline.  Psychiatric:        Mood and Affect: Mood normal.        Behavior: Behavior normal.      UC Treatments / Results  Labs (all labs ordered are listed, but only abnormal results are displayed) Labs Reviewed  HIV ANTIBODY (ROUTINE TESTING W REFLEX)  RPR  CYTOLOGY, (ORAL, ANAL, URETHRAL) ANCILLARY ONLY    EKG   Radiology No results found.  Procedures Procedures (including critical care time)  Medications Ordered in UC Medications  cefTRIAXone (ROCEPHIN) injection 500 mg (has no administration in time range)    Initial Impression / Assessment and Plan / UC Course  I have reviewed the triage vital signs and the nursing notes.  Pertinent  labs & imaging results that were available during my care of the patient were reviewed by me and considered in my medical decision making (see chart for details).  Penile discharge   will prophylactically provide coverage for gonorrhea, Rocephin injection given in office, STI labs are pending, will treat further per protocol, advised abstinence for the next 7 days and until lab results, treatment is complete and symptoms have resolved, advised condom use during all sexual encounters moving forward, may follow-up with this urgent care as needed Final Clinical Impressions(s) / UC Diagnoses   Final diagnoses:  Penile discharge     Discharge Instructions      Today you have been treated prophylactically for gonorrhea with a injection of Rocephin, please refrain from having sex for the next 7 days  Labs pending 2-3 days, you will be contacted if positive for any sti and treatment will be sent to the pharmacy, you will have to return to the clinic if positive for gonorrhea to receive treatment   Please refrain from having sex until labs results, if positive please refrain from having sex until treatment complete and symptoms resolve   If positive for HIV, Syphilis, Chlamydia  gonorrhea or trichomoniasis please notify partner or partners so they may tested as well  Moving forward, it is recommended you use some form of protection against the transmission of sti infections  such as condoms or dental dams with each sexual encounter     ED Prescriptions   None    PDMP not reviewed this encounter.   Valinda Hoar, NP 05/13/22 1047

## 2022-05-13 NOTE — ED Triage Notes (Signed)
Pt presents with penile discharge since yesterday. He would like STD testing.

## 2022-05-13 NOTE — Discharge Instructions (Addendum)
Today you have been treated prophylactically for gonorrhea with a injection of Rocephin, please refrain from having sex for the next 7 days  Labs pending 2-3 days, you will be contacted if positive for any sti and treatment will be sent to the pharmacy, you will have to return to the clinic if positive for gonorrhea to receive treatment   Please refrain from having sex until labs results, if positive please refrain from having sex until treatment complete and symptoms resolve   If positive for HIV, Syphilis, Chlamydia  gonorrhea or trichomoniasis please notify partner or partners so they may tested as well  Moving forward, it is recommended you use some form of protection against the transmission of sti infections  such as condoms or dental dams with each sexual encounter

## 2022-05-14 LAB — RPR: RPR Ser Ql: NONREACTIVE

## 2022-05-14 LAB — HIV ANTIBODY (ROUTINE TESTING W REFLEX): HIV Screen 4th Generation wRfx: NONREACTIVE

## 2022-05-15 ENCOUNTER — Telehealth (HOSPITAL_COMMUNITY): Payer: Self-pay | Admitting: Emergency Medicine

## 2022-05-15 LAB — CYTOLOGY, (ORAL, ANAL, URETHRAL) ANCILLARY ONLY
Chlamydia: POSITIVE — AB
Comment: NEGATIVE
Comment: NEGATIVE
Comment: NORMAL
Neisseria Gonorrhea: POSITIVE — AB
Trichomonas: NEGATIVE

## 2022-05-15 MED ORDER — DOXYCYCLINE HYCLATE 100 MG PO CAPS: 100.0000 mg | ORAL_CAPSULE | Freq: Two times a day (BID) | ORAL | 0 refills | Status: AC

## 2022-06-12 ENCOUNTER — Ambulatory Visit: Payer: Medicaid Other | Admitting: Internal Medicine

## 2022-11-29 ENCOUNTER — Other Ambulatory Visit: Payer: Self-pay | Admitting: Podiatry

## 2022-12-26 ENCOUNTER — Ambulatory Visit: Payer: Medicaid Other | Admitting: Urology

## 2023-02-11 ENCOUNTER — Encounter: Payer: Medicaid Other | Admitting: Urology

## 2023-02-12 ENCOUNTER — Encounter: Payer: Medicaid Other | Admitting: Urology

## 2023-02-20 ENCOUNTER — Encounter: Payer: Self-pay | Admitting: Urology

## 2023-02-20 ENCOUNTER — Ambulatory Visit (INDEPENDENT_AMBULATORY_CARE_PROVIDER_SITE_OTHER): Payer: Medicaid Other | Admitting: Urology

## 2023-02-20 VITALS — BP 107/72 | HR 76 | Ht 72.0 in | Wt 170.0 lb

## 2023-02-20 DIAGNOSIS — N529 Male erectile dysfunction, unspecified: Secondary | ICD-10-CM | POA: Diagnosis not present

## 2023-02-20 MED ORDER — TADALAFIL 5 MG PO TABS: 2.5000 mg | ORAL_TABLET | Freq: Every day | ORAL | 3 refills | Status: AC

## 2023-02-20 NOTE — Patient Instructions (Signed)
Your prescription was sent to [email or web address].  You will need to go to the website and set up a free account.  Erectile Dysfunction Erectile dysfunction (ED) is the inability to get or keep an erection in order to have sexual intercourse. ED is considered a symptom of an underlying disorder and is not considered a disease. ED may include: Inability to get an erection. Lack of enough hardness of the erection to allow penetration. Loss of erection before sex is finished. What are the causes? This condition may be caused by: Physical causes, such as: Artery problems. This may include heart disease, high blood pressure, atherosclerosis, and diabetes. Hormonal problems, such as low testosterone. Obesity. Nerve problems. This may include back or pelvic injuries, multiple sclerosis, Parkinson's disease, spinal cord injury, and stroke. Certain medicines, such as: Pain relievers. Antidepressants. Blood pressure medicines and water pills (diuretics). Cancer medicines. Antihistamines. Muscle relaxants. Lifestyle factors, such as: Use of drugs such as marijuana, cocaine, or opioids. Excessive use of alcohol. Smoking. Lack of physical activity or exercise. Psychological causes, such as: Anxiety or stress. Sadness or depression. Exhaustion. Fear about sexual performance. Guilt. What are the signs or symptoms? Symptoms of this condition include: Inability to get an erection. Lack of enough hardness of the erection to allow penetration. Loss of the erection before sex is finished. Sometimes having normal erections, but with frequent unsatisfactory episodes. Low sexual satisfaction in either partner due to erection problems. A curved penis occurring with erection. The curve may cause pain, or the penis may be too curved to allow for intercourse. Never having nighttime or morning erections. How is this diagnosed? This condition is often diagnosed by: Performing a physical exam to find  other diseases or specific problems with the penis. Asking you detailed questions about the problem. Doing tests, such as: Blood tests to check for diabetes mellitus or high cholesterol, or to measure hormone levels. Other tests to check for underlying health conditions. An ultrasound exam to check for scarring. A test to check blood flow to the penis. Doing a sleep study at home to measure nighttime erections. How is this treated? This condition may be treated by: Medicines, such as: Medicine taken by mouth to help you achieve an erection (oral medicine). Hormone replacement therapy to replace low testosterone levels. Medicine that is injected into the penis. Your health care provider may instruct you how to give yourself these injections at home. Medicine that is delivered with a short applicator tube. The tube is inserted into the opening at the tip of the penis, which is the opening of the urethra. A tiny pellet of medicine is put in the urethra. The pellet dissolves and enhances erectile function. This is also called MUSE (medicated urethral system for erections) therapy. Vacuum pump. This is a pump with a ring on it. The pump and ring are placed on the penis and used to create pressure that helps the penis become erect. Penile implant surgery. In this procedure, you may receive: An inflatable implant. This consists of cylinders, a pump, and a reservoir. The cylinders can be inflated with a fluid that helps to create an erection, and they can be deflated after intercourse. A semi-rigid implant. This consists of two silicone rubber rods. The rods provide some rigidity. They are also flexible, so the penis can both curve downward in its normal position and become straight for sexual intercourse. Blood vessel surgery to improve blood flow to the penis. During this procedure, a blood vessel from a different part  of the body is placed into the penis to allow blood to flow around (bypass) damaged  or blocked blood vessels. Lifestyle changes, such as exercising more, losing weight, and quitting smoking. Follow these instructions at home: Medicines  Take over-the-counter and prescription medicines only as told by your health care provider. Do not increase the dosage without first discussing it with your health care provider. If you are using self-injections, do injections as directed by your health care provider. Make sure you avoid any veins that are on the surface of the penis. After giving an injection, apply pressure to the injection site for 5 minutes. Talk to your health care provider about how to prevent headaches while taking ED medicines. These medicines may cause a sudden headache due to the increase in blood flow in your body. General instructions Exercise regularly, as directed by your health care provider. Work with your health care provider to lose weight, if needed. Do not use any products that contain nicotine or tobacco. These products include cigarettes, chewing tobacco, and vaping devices, such as e-cigarettes. If you need help quitting, ask your health care provider. Before using a vacuum pump, read the instructions that come with the pump and discuss any questions with your health care provider. Keep all follow-up visits. This is important. Contact a health care provider if: You feel nauseous. You are vomiting. You get sudden headaches while taking ED medicines. You have any concerns about your sexual health. Get help right away if: You are taking oral or injectable medicines and you have an erection that lasts longer than 4 hours. If your health care provider is unavailable, go to the nearest emergency room for evaluation. An erection that lasts much longer than 4 hours can result in permanent damage to your penis. You have severe pain in your groin or abdomen. You develop redness or severe swelling of your penis. You have redness spreading at your groin or lower  abdomen. You are unable to urinate. You experience chest pain or a rapid heartbeat (palpitations) after taking oral medicines. These symptoms may represent a serious problem that is an emergency. Do not wait to see if the symptoms will go away. Get medical help right away. Call your local emergency services (911 in the [place] Do not drive yourself to the hospital. Summary Erectile dysfunction (ED) is the inability to get or keep an erection during sexual intercourse. This condition is diagnosed based on a physical exam, your symptoms, and tests to determine the cause. Treatment varies depending on the cause and may include medicines, hormone therapy, surgery, or a vacuum pump. You may need follow-up visits to make sure that you are using your medicines or devices correctly. Get help right away if you are taking or injecting medicines and you have an erection that lasts longer than 4 hours. This information is not intended to replace advice given to you by your health care provider. Make sure you discuss any questions you have with your health care provider. Document Revised: 01/10/2021 Document Reviewed: 01/10/2021 Elsevier Patient Education  2023 [employer].

## 2023-02-20 NOTE — Progress Notes (Signed)
   02/20/23 12:39 PM   MAURISIO RUDDY [birth year] [id number]  CC: Erectile dysfunction  HPI: Healthy 22 year old male who is bisexual who reports erectile dysfunction over the last few years intermittently.  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  He uses marijuana but denies any other drug use.  He has problems both getting and obtaining erections.  History of STDs, denies any recent STDs in the last year.  Has never tried any medications for erections, no prior testosterone levels.   PMH: Past Medical History:  Diagnosis Date   Hives     Family History: Family History  Problem Relation Age of Onset   Hypertension Mother    Aneurysm Mother    Diabetes Other    Hypertension Other    Heart disease Other    Stroke Other    Cancer Other    Hypertension Father    Diabetes Maternal Grandfather    Diabetes Paternal Grandmother    Hypertension Paternal Grandmother    Food Allergy Paternal Aunt     Social History:  reports that he has been smoking cigarettes. He has been exposed to tobacco smoke. He has never used smokeless tobacco. He reports current drug use. Drug: Marijuana. He reports that he does not drink alcohol.  Physical Exam: BP 107/72   Pulse 76   Ht 6' (1.829 m)   Wt 170 lb (77.1 kg)   BMI 23.06 kg/m    Constitutional:  Alert and oriented, No acute distress. Cardiovascular: No clubbing, cyanosis, or edema. Respiratory: Normal respiratory effort, no increased work of breathing. 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  Reassurance provided at length, normal exam.  We discussed effect of marijuana on erections as well as smoking, and we focused on behavioral strategies including healthy eating, decrease stress, adequate sleep.  I also recommended  a trial of Cialis 2.5 to 5 mg daily, we reviewed this could be taken daily, every other day, or as needed.  Many young healthy patients can come off the Cialis after a month if erections have improved and this is more of a psychogenic issue.  I also offered him a testosterone per the AUA guideline recommendations, but suspect this would be normal and he defers which I think is reasonable.  Return precautions were discussed.  Trial of Cialis 2.5 to 5 mg daily-> can be filled by PCP in the future if doing well   Legrand Rams, MD 02/20/2023  [facility] [address] [place], [place] [address] [phone]

## 2023-06-02 ENCOUNTER — Telehealth: Payer: Self-pay | Admitting: Pharmacy Technician

## 2023-06-02 ENCOUNTER — Other Ambulatory Visit (HOSPITAL_COMMUNITY): Payer: Self-pay

## 2023-06-02 NOTE — Telephone Encounter (Signed)
[facility] Patient Advocate Encounter  Insurance verification completed.    The patient is uninsured and will need patient assistance for medication.  We can complete the application and will need to meet with the patient for signatures and income documentation.    

## 2023-06-03 ENCOUNTER — Encounter: Payer: Medicaid Other | Admitting: Pharmacist

## 2023-06-05 ENCOUNTER — Ambulatory Visit: Payer: Self-pay | Admitting: Podiatry

## 2023-06-10 ENCOUNTER — Other Ambulatory Visit: Payer: Self-pay

## 2023-06-10 ENCOUNTER — Ambulatory Visit (INDEPENDENT_AMBULATORY_CARE_PROVIDER_SITE_OTHER): Payer: Self-pay | Admitting: Pharmacist

## 2023-06-10 DIAGNOSIS — Z113 Encounter for screening for infections with a predominantly sexual mode of transmission: Secondary | ICD-10-CM

## 2023-06-10 DIAGNOSIS — Z2981 Encounter for HIV pre-exposure prophylaxis: Secondary | ICD-10-CM

## 2023-06-10 NOTE — Progress Notes (Cosign Needed Addendum)
Date:  06/10/2023   HPI: Brendan Miller is a 22 y.o. male who presents to the [facility] pharmacy clinic to discuss and initiate PrEP.  Insured   []    Uninsured  [x]    There are no problems to display for this patient.   Patient's Medications  New Prescriptions   No medications on file  Previous Medications   CLOTRIMAZOLE-BETAMETHASONE (LOTRISONE) CREAM    APPLY TOPICALLY TWO TIMES DAILY   TADALAFIL (CIALIS) 5 MG TABLET    Take 0.5 tablets (2.5 mg total) by mouth daily.   TERBINAFINE (LAMISIL) 250 MG TABLET    Take 1 tablet (250 mg total) by mouth daily.  Modified Medications   No medications on file  Discontinued Medications   No medications on file       06/10/2023    2:08 PM  [facility] HIV PREP FLOWSHEET RESULTS  Insurance Status Uninsured  Gender at birth Male  Gender identity cis-Male  Risk for HIV Condomless vaginal or anal intercourse;Hx of STI  Sex Partners Both men and women  # sex partners past 3-6 mos 2  Sex activity preferences Insertive;Oral  Condom use Yes  % condom use 50  Treated for STI? Yes  HIV symptoms? None  PrEP Eligibility Yes    Labs:  SCr: Lab Results  Component Value Date   CREATININE 0.84 08/24/2014   CREATININE 0.86 12/09/2013   CREATININE 0.75 10/07/2011   CREATININE 0.7 01/23/2010   HIV Lab Results  Component Value Date   HIV Non Reactive 05/13/2022   HIV Non Reactive 02/11/2022   HIV Non Reactive 04/21/2018   Hepatitis B No results found for: "HEPBSAB", "HEPBSAG", "HEPBCAB" Hepatitis C No results found for: "HEPCAB", "HCVRNAPCRQN" Hepatitis A No results found for: "HAV" RPR and STI Lab Results  Component Value Date   LABRPR Non Reactive 05/13/2022   LABRPR Non Reactive 02/11/2022   LABRPR Non Reactive 01/06/2019    STI Results GC GC CT CT  05/13/2022 10:42 AM Positive   Positive    02/11/2022 12:02 PM Negative   Negative    01/06/2019  1:06 PM   Negative    04/21/2018 11:51 AM   Negative    05/13/2017 11:27 AM    Negative    04/29/2017  4:00 PM   Negative    03/20/2016 11:23 AM  NOT DETECTED   NOT DETECTED     Assessment: Brendan Miller presented today for STI testing and initiation of PrEP. He was tested for STIs in July 2024 and was positive for both chlamydia and gonorrhea. He received appropriate treatment for both infections at that time. He has not been tested since then. He does not report any signs or symptoms of an STI today. He did report a bump in his pubic region that is not painful but has been there for about a month. Brendan Miller was able to come evaluate the bump and expressed that it did not look suspicious for any STI or other infection.   Patient was asked about their interest in PrEP and was educated on both oral and injectable options. He reports both male and male partners, with 2 partners in the last 3 months. He uses condoms about 50% of the time and engages in both insertive oral and rectal intercourse. He was in agreement to initiate Descovy today, pending a negative HIV test. He does not report any symptoms of acute HIV. Current medications were screened for drug-drug interactions and none were identified. He was educated on the  importance of adherence with Descovy to provide the best protection. He was also educated on expected side effects with the medication and to call if he were to initiate any new prescription or herbal medications.    Plan: - Obtain RPR and urine/oral STI screening - Obtain HIV antibody today - Obtain baseline CMP, lipid panel, and hepatitis A, B and C screening - Initiate Descovy if HIV antibody is negative  - Follow-up visit scheduled on 11/13 at 10:30 am with Cassie  - Contact with any questions or concerns   Lennie Muckle, PharmD PGY1 Pharmacy Resident 06/10/2023 2:37 PM

## 2023-06-11 ENCOUNTER — Other Ambulatory Visit (HOSPITAL_COMMUNITY): Payer: Self-pay

## 2023-06-11 ENCOUNTER — Encounter: Payer: Self-pay | Admitting: Pharmacist

## 2023-06-11 ENCOUNTER — Other Ambulatory Visit: Payer: Self-pay

## 2023-06-11 ENCOUNTER — Other Ambulatory Visit: Payer: Self-pay | Admitting: Pharmacist

## 2023-06-11 ENCOUNTER — Telehealth: Payer: Self-pay

## 2023-06-11 MED ORDER — DESCOVY 200-25 MG PO TABS
1.0000 | ORAL_TABLET | Freq: Every day | ORAL | 2 refills | Status: DC
  Filled 2023-06-11 (×2): qty 30, 30d supply, fill #0
  Filled 2023-07-01: qty 30, 30d supply, fill #1
  Filled 2023-08-11 (×2): qty 30, 30d supply, fill #2

## 2023-06-11 NOTE — Telephone Encounter (Signed)
[facility] Patient Advocate Encounter   Patient has been approved for [employer] Advancing Access Patient Assistance Program for Descovy from 06/11/23 to 10/28/23.  This assistance will make the patient's copay $0.00.            Patient knows to call the office with questions or concerns.  Clearance Coots, CPhT Specialty Pharmacy Patient [facility] Phone: [phone] Fax: [phone] 06/11/2023 2:32 PM

## 2023-06-12 NOTE — Telephone Encounter (Signed)
Here ya go!  

## 2023-07-01 ENCOUNTER — Other Ambulatory Visit (HOSPITAL_COMMUNITY): Payer: Self-pay

## 2023-07-07 ENCOUNTER — Other Ambulatory Visit (HOSPITAL_COMMUNITY): Payer: Self-pay

## 2023-07-30 ENCOUNTER — Other Ambulatory Visit (HOSPITAL_COMMUNITY): Payer: Self-pay

## 2023-08-01 ENCOUNTER — Other Ambulatory Visit (HOSPITAL_COMMUNITY): Payer: Self-pay

## 2023-08-04 ENCOUNTER — Other Ambulatory Visit: Payer: Self-pay

## 2023-08-11 ENCOUNTER — Other Ambulatory Visit: Payer: Self-pay

## 2023-08-11 NOTE — Progress Notes (Signed)
.  [facility] Refill Coordination Note  Brendan Miller is a 22 y.o. male contacted today regarding refills of specialty medication(s) Emtricitabine-Tenofovir Af   Patient requested Delivery   Delivery date: 08/12/23   Verified address: [address] [place] [address]   Medication will be filled on 08/11/23.

## 2023-08-19 ENCOUNTER — Ambulatory Visit: Payer: Self-pay | Admitting: Podiatry

## 2023-08-28 ENCOUNTER — Ambulatory Visit: Payer: Medicaid Other | Admitting: Podiatry

## 2023-08-28 ENCOUNTER — Encounter: Payer: Self-pay | Admitting: Podiatry

## 2023-08-28 ENCOUNTER — Other Ambulatory Visit: Payer: Self-pay

## 2023-08-28 VITALS — Ht 72.0 in | Wt 170.0 lb

## 2023-08-28 DIAGNOSIS — B351 Tinea unguium: Secondary | ICD-10-CM

## 2023-08-28 DIAGNOSIS — Z79899 Other long term (current) drug therapy: Secondary | ICD-10-CM

## 2023-08-28 MED ORDER — CLOTRIMAZOLE-BETAMETHASONE 1-0.05 % EX CREA: 1.0000 | TOPICAL_CREAM | Freq: Every day | CUTANEOUS | 0 refills | Status: DC

## 2023-08-28 MED ORDER — TERBINAFINE HCL 250 MG PO TABS: 250.0000 mg | ORAL_TABLET | Freq: Every day | ORAL | 0 refills | Status: AC

## 2023-08-28 NOTE — Progress Notes (Signed)
Subjective:  Patient ID: Brendan Miller, male    DOB: 2000/11/29,  MRN: [id number]  Chief Complaint  Patient presents with   Nail Problem    Patient is here for nail issues need cost of nail removal paying out of pocket    22 y.o. male presents with the above complaint.  Patient presents with bilateral hallux thickened and onychodystrophy mycotic nail x 4.  Patient has been present for quite some time.  He wants to discuss treatment options.  He also has athlete's foot to bilateral fifth for which she would like to discuss treatment options for as well.  He has not seen anyone else prior to seeing me for this.  Denies any other acute complaints.  He is tried topical medication which has not helped.   Review of Systems: Negative except as noted in the HPI. Denies N/V/F/Ch.  Past Medical History:  Diagnosis Date   Hives     Current Outpatient Medications:    clotrimazole-betamethasone (LOTRISONE) cream, APPLY TOPICALLY TWO TIMES DAILY, Disp: 45 g, Rfl: 1   clotrimazole-betamethasone (LOTRISONE) cream, Apply 1 Application topically daily., Disp: 30 g, Rfl: 0   emtricitabine-tenofovir AF (DESCOVY) 200-25 MG tablet, Take 1 tablet by mouth daily., Disp: 30 tablet, Rfl: 2   tadalafil (CIALIS) 5 MG tablet, Take 0.5 tablets (2.5 mg total) by mouth daily., Disp: 90 tablet, Rfl: 3   terbinafine (LAMISIL) 250 MG tablet, Take 1 tablet (250 mg total) by mouth daily., Disp: 90 tablet, Rfl: 0   terbinafine (LAMISIL) 250 MG tablet, Take 1 tablet (250 mg total) by mouth daily., Disp: 90 tablet, Rfl: 0  Social History   Tobacco Use  Smoking Status Every Day   Types: Cigarettes   Passive exposure: Current  Smokeless Tobacco Never    Allergies  Allergen Reactions   Wheat Hives   Objective:  There were no vitals filed for this visit. Body mass index is 23.06 kg/m. Constitutional Well developed. Well nourished.  Vascular Dorsalis pedis pulses palpable bilaterally. Posterior tibial pulses  palpable bilaterally. Capillary refill normal to all digits.  No cyanosis or clubbing noted. Pedal hair growth normal.  Neurologic Normal speech. Oriented to person, place, and time. Epicritic sensation to light touch grossly present bilaterally.  Dermatologic Nails thickened and elongated dystrophic mycotic nail bilateral hallux and fifth digit Skin skin epidermolysis with subjective component of itching noted to athlete's foot bilateral  Orthopedic: Normal joint ROM without pain or crepitus bilaterally. No visible deformities. No bony tenderness.   Radiographs: None Assessment:   1. Onychomycosis due to dermatophyte   2. Nail fungus   3. Long-term use of high-risk medication    Plan:  Patient was evaluated and treated and all questions answered.  Bilateral hallux and fifth digit onychomycosis/nail fungus -Educated the patient on the etiology of onychomycosis and various treatment options associated with improving the fungal load.  I explained to the patient that there is 3 treatment options available to treat the onychomycosis including topical, p.o., laser treatment.  Patient elected to undergo p.o. options with Lamisil/terbinafine therapy.  In order for me to start the medication therapy, I explained to the patient the importance of evaluating the liver and obtaining the liver function test.  Once the liver function test comes back normal I will start him on 48-month course of Lamisil therapy.  Patient understood all risk and would like to proceed with Lamisil therapy.  I have asked the patient to immediately stop the Lamisil therapy if she has any  reactions to it and call the office or go to the emergency room right away.  Patient states understanding -Patient is liver function looks good Lamisil was sent to the pharmacy  Bilateral athlete's foot -I explained to patient the etiology of athlete's foot emergency room and options were discussed.  At this time patient would benefit from  Lotrisone cream Lotrisone cream was sent to the pharmacy.  Advised to apply twice a day  No follow-ups on file.

## 2023-09-04 ENCOUNTER — Ambulatory Visit: Payer: Medicaid Other | Admitting: Podiatry

## 2023-09-09 NOTE — Progress Notes (Unsigned)
HPI: Brendan Miller is a 22 y.o. male who presents to the [facility] pharmacy clinic for HIV PrEP follow-up.  Insured   []    Uninsured  [x]    There are no problems to display for this patient.   Patient's Medications  New Prescriptions   No medications on file  Previous Medications   CLOTRIMAZOLE-BETAMETHASONE (LOTRISONE) CREAM    APPLY TOPICALLY TWO TIMES DAILY   CLOTRIMAZOLE-BETAMETHASONE (LOTRISONE) CREAM    Apply 1 Application topically daily.   EMTRICITABINE-TENOFOVIR AF (DESCOVY) 200-25 MG TABLET    Take 1 tablet by mouth daily.   TADALAFIL (CIALIS) 5 MG TABLET    Take 0.5 tablets (2.5 mg total) by mouth daily.   TERBINAFINE (LAMISIL) 250 MG TABLET    Take 1 tablet (250 mg total) by mouth daily.   TERBINAFINE (LAMISIL) 250 MG TABLET    Take 1 tablet (250 mg total) by mouth daily.  Modified Medications   No medications on file  Discontinued Medications   No medications on file       06/10/2023    2:08 PM  [facility] HIV PREP FLOWSHEET RESULTS  Insurance Status Uninsured  Gender at birth Male  Gender identity cis-Male  Risk for HIV Condomless vaginal or anal intercourse;Hx of STI  Sex Partners Both men and women  # sex partners past 3-6 mos 2  Sex activity preferences Insertive;Oral  Condom use Yes  % condom use 50  Treated for STI? Yes  HIV symptoms? None  PrEP Eligibility Yes    Labs:  SCr: Lab Results  Component Value Date   CREATININE 0.94 06/10/2023   CREATININE 0.84 08/24/2014   CREATININE 0.86 12/09/2013   CREATININE 0.75 10/07/2011   CREATININE 0.7 01/23/2010   HIV Lab Results  Component Value Date   HIV NON-REACTIVE 06/10/2023   HIV Non Reactive 05/13/2022   HIV Non Reactive 02/11/2022   HIV Non Reactive 04/21/2018   Hepatitis B Lab Results  Component Value Date   HEPBSAB REACTIVE (A) 06/10/2023   HEPBSAG NON-REACTIVE 06/10/2023   Hepatitis C Lab Results  Component Value Date   HEPCAB NON-REACTIVE 06/10/2023   Hepatitis A Lab Results   Component Value Date   HAV REACTIVE (A) 06/10/2023   RPR and STI Lab Results  Component Value Date   LABRPR NON-REACTIVE 06/10/2023   LABRPR Non Reactive 05/13/2022   LABRPR Non Reactive 02/11/2022   LABRPR Non Reactive 01/06/2019    STI Results GC GC CT CT  06/10/2023  2:12 PM Negative    Negative   Negative    Negative    05/13/2022 10:42 AM Positive   Positive    02/11/2022 12:02 PM Negative   Negative    01/06/2019  1:06 PM   Negative    04/21/2018 11:51 AM   Negative    05/13/2017 11:27 AM   Negative    04/29/2017  4:00 PM   Negative    03/20/2016 11:23 AM  NOT DETECTED   NOT DETECTED     Assessment: Brendan Miller presents today for follow up on PrEP. He is currently taking Descovy. He reports *** concerns for adverse effects. Brendan Miller *** STI testing today. He reports *** concerns for known exposure, and *** signs/symptoms of STI.  Connected Pastor with *** to consider signing up for Medicaid. He should consider this if his income qualifies him.  Plan: - HIV ab, RPR, oral/rectal/urine cytologies for gonorrhea/chlamydia - Descovy refills once HIV ab returns negative - Call with any questions or concerns.  Lora Paula, PharmD PGY-2 Infectious Diseases Pharmacy Resident [facility] 09/09/2023 12:44 PM

## 2023-09-10 ENCOUNTER — Other Ambulatory Visit: Payer: Self-pay

## 2023-09-10 ENCOUNTER — Ambulatory Visit: Payer: Self-pay | Admitting: Pharmacist

## 2023-09-11 ENCOUNTER — Other Ambulatory Visit: Payer: Self-pay

## 2023-09-12 ENCOUNTER — Other Ambulatory Visit: Payer: Self-pay

## 2023-09-15 ENCOUNTER — Other Ambulatory Visit: Payer: Self-pay

## 2023-09-17 ENCOUNTER — Other Ambulatory Visit (HOSPITAL_COMMUNITY): Payer: Self-pay

## 2023-09-17 ENCOUNTER — Other Ambulatory Visit: Payer: Self-pay | Admitting: Pharmacist

## 2023-09-17 ENCOUNTER — Other Ambulatory Visit: Payer: Self-pay

## 2023-09-17 NOTE — Progress Notes (Signed)
Specialty Pharmacy Refill Coordination Note  Brendan Miller is a 22 y.o. male contacted today regarding refills of specialty medication(s) Emtricitabine-Tenofovir Af   Patient requested Delivery   Delivery date: 09/19/23   Verified address: [address] [place] [address]   Medication will be filled on 09/18/23. *Pending Refill Request*   Patient was not able to make appointment on 09/10/23, and is aware that the refill request may be denied.

## 2023-09-24 ENCOUNTER — Other Ambulatory Visit: Payer: Self-pay

## 2023-10-03 ENCOUNTER — Other Ambulatory Visit: Payer: Self-pay

## 2023-10-13 ENCOUNTER — Other Ambulatory Visit: Payer: Self-pay

## 2023-10-13 NOTE — Progress Notes (Deleted)
HPI: Brendan Miller is a 22 y.o. male who presents to the [facility] pharmacy clinic for HIV PrEP follow-up.  Insured   []    Uninsured  []    There are no active problems to display for this patient.   Patient's Medications  New Prescriptions   No medications on file  Previous Medications   CLOTRIMAZOLE-BETAMETHASONE (LOTRISONE) CREAM    APPLY TOPICALLY TWO TIMES DAILY   CLOTRIMAZOLE-BETAMETHASONE (LOTRISONE) CREAM    Apply 1 Application topically daily.   EMTRICITABINE-TENOFOVIR AF (DESCOVY) 200-25 MG TABLET    Take 1 tablet by mouth daily.   TADALAFIL (CIALIS) 5 MG TABLET    Take 0.5 tablets (2.5 mg total) by mouth daily.   TERBINAFINE (LAMISIL) 250 MG TABLET    Take 1 tablet (250 mg total) by mouth daily.   TERBINAFINE (LAMISIL) 250 MG TABLET    Take 1 tablet (250 mg total) by mouth daily.  Modified Medications   No medications on file  Discontinued Medications   No medications on file       06/10/2023    2:08 PM  [facility] HIV PREP FLOWSHEET RESULTS  Insurance Status Uninsured  Gender at birth Male  Gender identity cis-Male  Risk for HIV Condomless vaginal or anal intercourse;Hx of STI  Sex Partners Both men and women  # sex partners past 3-6 mos 2  Sex activity preferences Insertive;Oral  Condom use Yes  % condom use 50  Treated for STI? Yes  HIV symptoms? None  PrEP Eligibility Yes    Labs:  SCr: Lab Results  Component Value Date   CREATININE 0.94 06/10/2023   CREATININE 0.84 08/24/2014   CREATININE 0.86 12/09/2013   CREATININE 0.75 10/07/2011   CREATININE 0.7 01/23/2010   HIV Lab Results  Component Value Date   HIV NON-REACTIVE 06/10/2023   HIV Non Reactive 05/13/2022   HIV Non Reactive 02/11/2022   HIV Non Reactive 04/21/2018   Hepatitis B Lab Results  Component Value Date   HEPBSAB REACTIVE (A) 06/10/2023   HEPBSAG NON-REACTIVE 06/10/2023   Hepatitis C Lab Results  Component Value Date   HEPCAB NON-REACTIVE 06/10/2023   Hepatitis A Lab Results   Component Value Date   HAV REACTIVE (A) 06/10/2023   RPR and STI Lab Results  Component Value Date   LABRPR NON-REACTIVE 06/10/2023   LABRPR Non Reactive 05/13/2022   LABRPR Non Reactive 02/11/2022   LABRPR Non Reactive 01/06/2019    STI Results GC GC CT CT  06/10/2023  2:12 PM Negative    Negative   Negative    Negative    05/13/2022 10:42 AM Positive   Positive    02/11/2022 12:02 PM Negative   Negative    01/06/2019  1:06 PM   Negative    04/21/2018 11:51 AM   Negative    05/13/2017 11:27 AM   Negative    04/29/2017  4:00 PM   Negative    03/20/2016 11:23 AM  NOT DETECTED   NOT DETECTED     Assessment: Brendan Miller comes in today for PrEP follow up. Last seen in August, has not filled his Descovy since 08/11/23 as he ran out of refills and missed his appointment in November. Screened for acute HIV symptoms such as fatigue, muscle aches, rash, sore throat, lymphadenopathy, headache, night sweats, nausea/vomiting/diarrhea, and fever. Denies any symptoms.    Plan: - HIV antibody, RPR, and urine/rectal/pharyngeal cytologies for GC/chlamydia  - Descovy x 3 months if HIV negative  - Follow up with  me again on ***  Brendan Miller L. Brendan Miller, PharmD, BCIDP, AAHIVP, CPP Clinical Pharmacist Practitioner Infectious Diseases Clinical Pharmacist [facility] 10/13/2023, 3:49 PM

## 2023-10-14 ENCOUNTER — Other Ambulatory Visit (HOSPITAL_COMMUNITY): Payer: Self-pay

## 2023-10-14 ENCOUNTER — Ambulatory Visit: Payer: Self-pay | Admitting: Pharmacist

## 2023-10-14 DIAGNOSIS — Z113 Encounter for screening for infections with a predominantly sexual mode of transmission: Secondary | ICD-10-CM

## 2023-10-14 DIAGNOSIS — Z2981 Encounter for HIV pre-exposure prophylaxis: Secondary | ICD-10-CM

## 2023-10-17 NOTE — Progress Notes (Deleted)
HPI: Brendan Miller is a 22 y.o. male who presents to the [facility] pharmacy clinic for HIV PrEP follow-up.  Insured   []    Uninsured  []    There are no active problems to display for this patient.   Patient's Medications  New Prescriptions   No medications on file  Previous Medications   CLOTRIMAZOLE-BETAMETHASONE (LOTRISONE) CREAM    APPLY TOPICALLY TWO TIMES DAILY   CLOTRIMAZOLE-BETAMETHASONE (LOTRISONE) CREAM    Apply 1 Application topically daily.   EMTRICITABINE-TENOFOVIR AF (DESCOVY) 200-25 MG TABLET    Take 1 tablet by mouth daily.   TADALAFIL (CIALIS) 5 MG TABLET    Take 0.5 tablets (2.5 mg total) by mouth daily.   TERBINAFINE (LAMISIL) 250 MG TABLET    Take 1 tablet (250 mg total) by mouth daily.   TERBINAFINE (LAMISIL) 250 MG TABLET    Take 1 tablet (250 mg total) by mouth daily.  Modified Medications   No medications on file  Discontinued Medications   No medications on file       06/10/2023    2:08 PM  [facility] HIV PREP FLOWSHEET RESULTS  Insurance Status Uninsured  Gender at birth Male  Gender identity cis-Male  Risk for HIV Condomless vaginal or anal intercourse;Hx of STI  Sex Partners Both men and women  # sex partners past 3-6 mos 2  Sex activity preferences Insertive;Oral  Condom use Yes  % condom use 50  Treated for STI? Yes  HIV symptoms? None  PrEP Eligibility Yes    Labs:  SCr: Lab Results  Component Value Date   CREATININE 0.94 06/10/2023   CREATININE 0.84 08/24/2014   CREATININE 0.86 12/09/2013   CREATININE 0.75 10/07/2011   CREATININE 0.7 01/23/2010   HIV Lab Results  Component Value Date   HIV NON-REACTIVE 06/10/2023   HIV Non Reactive 05/13/2022   HIV Non Reactive 02/11/2022   HIV Non Reactive 04/21/2018   Hepatitis B Lab Results  Component Value Date   HEPBSAB REACTIVE (A) 06/10/2023   HEPBSAG NON-REACTIVE 06/10/2023   Hepatitis C Lab Results  Component Value Date   HEPCAB NON-REACTIVE 06/10/2023   Hepatitis A Lab Results   Component Value Date   HAV REACTIVE (A) 06/10/2023   RPR and STI Lab Results  Component Value Date   LABRPR NON-REACTIVE 06/10/2023   LABRPR Non Reactive 05/13/2022   LABRPR Non Reactive 02/11/2022   LABRPR Non Reactive 01/06/2019    STI Results GC GC CT CT  06/10/2023  2:12 PM Negative    Negative   Negative    Negative    05/13/2022 10:42 AM Positive   Positive    02/11/2022 12:02 PM Negative   Negative    01/06/2019  1:06 PM   Negative    04/21/2018 11:51 AM   Negative    05/13/2017 11:27 AM   Negative    04/29/2017  4:00 PM   Negative    03/20/2016 11:23 AM  NOT DETECTED   NOT DETECTED     Assessment: Brendan Miller comes in today for PrEP follow up. Last seen in August, has not filled his Descovy since 08/11/23 as he ran out of refills and missed his appointment in November. Screened for acute HIV symptoms such as fatigue, muscle aches, rash, sore throat, lymphadenopathy, headache, night sweats, nausea/vomiting/diarrhea, and fever. Denies any symptoms.    Plan: - HIV antibody, RPR, and urine/rectal/pharyngeal cytologies for GC/chlamydia  - Descovy x 3 months if HIV negative  - Follow up with  me again on ***  Brendan Miller, PharmD, BCIDP, AAHIVP, CPP Clinical Pharmacist Practitioner Infectious Diseases Clinical Pharmacist [facility] 10/17/2023, 4:10 PM

## 2023-10-20 ENCOUNTER — Ambulatory Visit: Payer: Self-pay | Admitting: Pharmacist

## 2023-10-20 DIAGNOSIS — Z113 Encounter for screening for infections with a predominantly sexual mode of transmission: Secondary | ICD-10-CM

## 2023-10-20 DIAGNOSIS — Z2981 Encounter for HIV pre-exposure prophylaxis: Secondary | ICD-10-CM

## 2023-11-03 NOTE — Progress Notes (Signed)
 HPI: Brendan Miller is a 23 y.o. male who presents to the [facility] pharmacy clinic for HIV PrEP follow-up.  Insured   []    Uninsured  [x]    There are no active problems to display for this patient.   Patient's Medications  New Prescriptions   No medications on file  Previous Medications   CLOTRIMAZOLE -BETAMETHASONE  (LOTRISONE ) CREAM    APPLY TOPICALLY TWO TIMES DAILY   CLOTRIMAZOLE -BETAMETHASONE  (LOTRISONE ) CREAM    Apply 1 Application topically daily.   EMTRICITABINE -TENOFOVIR  AF (DESCOVY ) 200-25 MG TABLET    Take 1 tablet by mouth daily.   TADALAFIL  (CIALIS ) 5 MG TABLET    Take 0.5 tablets (2.5 mg total) by mouth daily.   TERBINAFINE  (LAMISIL ) 250 MG TABLET    Take 1 tablet (250 mg total) by mouth daily.   TERBINAFINE  (LAMISIL ) 250 MG TABLET    Take 1 tablet (250 mg total) by mouth daily.  Modified Medications   No medications on file  Discontinued Medications   No medications on file       06/10/2023    2:08 PM  [facility] HIV PREP FLOWSHEET RESULTS  Insurance Status Uninsured  Gender at birth Male  Gender identity cis-Male  Risk for HIV Condomless vaginal or anal intercourse;Hx of STI  Sex Partners Both men and women  # sex partners past 3-6 mos 2  Sex activity preferences Insertive;Oral  Condom use Yes  % condom use 50  Treated for STI? Yes  HIV symptoms? None  PrEP Eligibility Yes    Labs:  SCr: Lab Results  Component Value Date   CREATININE 0.94 06/10/2023   CREATININE 0.84 08/24/2014   CREATININE 0.86 12/09/2013   CREATININE 0.75 10/07/2011   CREATININE 0.7 01/23/2010   HIV Lab Results  Component Value Date   HIV NON-REACTIVE 06/10/2023   HIV Non Reactive 05/13/2022   HIV Non Reactive 02/11/2022   HIV Non Reactive 04/21/2018   Hepatitis B Lab Results  Component Value Date   HEPBSAB REACTIVE (A) 06/10/2023   HEPBSAG NON-REACTIVE 06/10/2023   Hepatitis C Lab Results  Component Value Date   HEPCAB NON-REACTIVE 06/10/2023   Hepatitis A Lab  Results  Component Value Date   HAV REACTIVE (A) 06/10/2023   RPR and STI Lab Results  Component Value Date   LABRPR NON-REACTIVE 06/10/2023   LABRPR Non Reactive 05/13/2022   LABRPR Non Reactive 02/11/2022   LABRPR Non Reactive 01/06/2019    STI Results GC GC CT CT  06/10/2023  2:12 PM Negative    Negative   Negative    Negative    05/13/2022 10:42 AM Positive   Positive    02/11/2022 12:02 PM Negative   Negative    01/06/2019  1:06 PM   Negative    04/21/2018 11:51 AM   Negative    05/13/2017 11:27 AM   Negative    04/29/2017  4:00 PM   Negative    03/20/2016 11:23 AM  NOT DETECTED   NOT DETECTED     Assessment: Brendan Miller presents today for HIV PrEP follow up. He is prescribed Descovy . Unfortunately we have not seen him since 06/10/23 due to missed appointments. Refill history is also sporadic. Screened for symptoms of STIs/acute HIV. He is sexually active, and reports last sexual encounter in November. Requests STI testing today with oral/urine cytologies for gonorrhea/chlamydia and RPR. Declines symptoms of STI at this time.  Discussed with him the importance of adherence to Descovy  when deciding to be on PrEP, as  sporadic adherence can be dangerous with regards to resistance if HIV transmission occurs. It would be best to be all-in with PrEP if he wishes to continue. He expressed understanding of this and would like to continue Descovy . He declines barriers to making it to appointments, and is looking forward to continuing PrEP.  Brendan Miller will follow up with Brendan Miller as he is able to see if he is eligible for Medicaid when he is able to bring by financial documents.   Plan: - HIV ab, STI testing with RPR and oral/rectal/urine cytologies for gonorrhea/chlamydia - Descovy  refills to [facility] when HIV ab negative - Follow up with Brendan Miller on 01/28/24 - Call with any questions or concerns  Con Laughter, PharmD PGY-2 Infectious Diseases Pharmacy Resident [facility] for Infectious  Disease

## 2023-11-04 ENCOUNTER — Other Ambulatory Visit: Payer: Self-pay

## 2023-11-04 ENCOUNTER — Ambulatory Visit: Payer: Self-pay | Admitting: Pharmacist

## 2023-11-04 DIAGNOSIS — Z2981 Encounter for HIV pre-exposure prophylaxis: Secondary | ICD-10-CM

## 2023-11-04 DIAGNOSIS — Z113 Encounter for screening for infections with a predominantly sexual mode of transmission: Secondary | ICD-10-CM

## 2023-11-05 LAB — CYTOLOGY, (ORAL, ANAL, URETHRAL) ANCILLARY ONLY
Chlamydia: NEGATIVE
Chlamydia: NEGATIVE
Comment: NEGATIVE
Comment: NEGATIVE
Comment: NORMAL
Comment: NORMAL
Neisseria Gonorrhea: NEGATIVE
Neisseria Gonorrhea: NEGATIVE

## 2023-11-05 LAB — RPR: RPR Ser Ql: NONREACTIVE

## 2023-11-05 LAB — URINE CYTOLOGY ANCILLARY ONLY
Chlamydia: NEGATIVE
Comment: NEGATIVE
Comment: NORMAL
Neisseria Gonorrhea: NEGATIVE

## 2023-11-05 LAB — HIV ANTIBODY (ROUTINE TESTING W REFLEX): HIV 1&2 Ab, 4th Generation: NONREACTIVE

## 2023-11-06 ENCOUNTER — Other Ambulatory Visit (HOSPITAL_COMMUNITY): Payer: Self-pay

## 2023-11-06 ENCOUNTER — Other Ambulatory Visit: Payer: Self-pay

## 2023-11-06 MED ORDER — DESCOVY 200-25 MG PO TABS
1.0000 | ORAL_TABLET | Freq: Every day | ORAL | 2 refills | Status: DC
  Filled 2023-11-06 – 2023-11-24 (×2): qty 30, 30d supply, fill #0
  Filled 2024-03-05: qty 30, 30d supply, fill #1

## 2023-11-06 NOTE — Progress Notes (Signed)
 HIV antibody negative. RX for Truvada sent. Patient prefers [facility] delivery. May require renewed medication assistance.

## 2023-11-21 ENCOUNTER — Other Ambulatory Visit: Payer: Self-pay

## 2023-11-24 ENCOUNTER — Other Ambulatory Visit (HOSPITAL_COMMUNITY): Payer: Self-pay | Admitting: Pharmacy Technician

## 2023-11-24 ENCOUNTER — Other Ambulatory Visit: Payer: Self-pay | Admitting: Pharmacist

## 2023-11-24 ENCOUNTER — Other Ambulatory Visit: Payer: Self-pay

## 2023-11-24 ENCOUNTER — Other Ambulatory Visit (HOSPITAL_COMMUNITY): Payer: Self-pay

## 2023-11-24 NOTE — Progress Notes (Signed)
Specialty Pharmacy Refill Coordination Note  Brendan Miller is a 23 y.o. male contacted today regarding refills of specialty medication(s) Emtricitabine-Tenofovir AF (Descovy)   Patient requested Delivery   Delivery date: 11/28/23   Verified address: Patient address [address]  [place]   Medication will be filled on 11/27/23.

## 2023-11-24 NOTE — Progress Notes (Signed)
Specialty Pharmacy Ongoing Clinical Assessment Note  Brendan Miller is a 23 y.o. male who is being followed by the specialty pharmacy service for RxSp HIV PrEP   Patient's specialty medication(s) reviewed today: Emtricitabine-Tenofovir AF (Descovy)   Missed doses in the last 4 weeks: All   Patient/Caregiver did not have any additional questions or concerns.   Therapeutic benefit summary: Patient is NOT achieving benefit   Adverse events/side effects summary: Unable to assess   Patient's therapy is appropriate to: Continue    Goals Addressed             This Visit's Progress    Comply with lab assessments       Patient is not on track and improving. Patient will adhere to provider and/or lab appointments      Maintain optimal adherence to therapy       Patient is not on track and no change. Patient will work on increased adherence      Practice safe sex       Patient is not on track and no change. Patient will work on increased adherence         Follow up:  6 months  Jennette Kettle Specialty Pharmacist

## 2023-11-27 ENCOUNTER — Other Ambulatory Visit: Payer: Self-pay

## 2023-12-03 NOTE — Progress Notes (Deleted)
 12/05/2023 11:06 AM   Brendan Miller 2001-10-28 [id number]  Referring provider: No referring provider defined for this encounter.  Urological history: 1.  Erectile dysfunction -Contributing factors of psychogenic, marijuana use and smoking -Testosterone level pending -Tadalafil  5 mg daily  No chief complaint on file.  HPI: Brendan Miller is a 23 y.o. male who presents today for erectile dysfunction.    Previous records reviewed.   SHIM ***    Score: 1-7 Severe ED 8-11 Moderate ED 12-16 Mild-Moderate ED 17-21 Mild ED 22-25 No ED   PMH: Past Medical History:  Diagnosis Date   Hives     Surgical History: Past Surgical History:  Procedure Laterality Date   HERNIA REPAIR      Home Medications:  Allergies as of 12/05/2023       Reactions   Wheat Hives        Medication List        Accurate as of December 03, 2023 11:06 AM. If you have any questions, ask your nurse or doctor.          clotrimazole -betamethasone  cream Commonly known as: LOTRISONE  APPLY TOPICALLY TWO TIMES DAILY   clotrimazole -betamethasone  cream Commonly known as: LOTRISONE  Apply 1 Application topically daily.   Descovy  200-25 MG tablet Generic drug: emtricitabine -tenofovir  AF Take 1 tablet by mouth daily.   tadalafil  5 MG tablet Commonly known as: CIALIS  Take 0.5 tablets (2.5 mg total) by mouth daily.   terbinafine  250 MG tablet Commonly known as: LamISIL  Take 1 tablet (250 mg total) by mouth daily.   terbinafine  250 MG tablet Commonly known as: LAMISIL  Take 1 tablet (250 mg total) by mouth daily.        Allergies:  Allergies  Allergen Reactions   Wheat Hives    Family History: Family History  Problem Relation Age of Onset   Hypertension Mother    Aneurysm Mother    Diabetes Other    Hypertension Other    Heart disease Other    Stroke Other    Cancer Other    Hypertension Father    Diabetes Maternal Grandfather    Diabetes Paternal  Grandmother    Hypertension Paternal Grandmother    Food Allergy Paternal Aunt     Social History:  reports that he has been smoking cigarettes. He has been exposed to tobacco smoke. He has never used smokeless tobacco. He reports current drug use. Drug: Marijuana. He reports that he does not drink alcohol.  ROS: Pertinent ROS in HPI  Physical Exam: There were no vitals taken for this visit.  Constitutional:  Well nourished. Alert and oriented, No acute distress. HEENT: [place] AT, moist mucus membranes.  Trachea midline, no masses. Cardiovascular: No clubbing, cyanosis, or edema. Respiratory: Normal respiratory effort, no increased work of breathing. GI: Abdomen is soft, non tender, non distended, no abdominal masses. Liver and spleen not palpable.  No hernias appreciated.  Stool sample for occult testing is not indicated.   GU: No CVA tenderness.  No bladder fullness or masses.  Patient with circumcised/uncircumcised phallus. ***Foreskin easily retracted***  Urethral meatus is patent.  No penile discharge. No penile lesions or rashes. Scrotum without lesions, cysts, rashes and/or edema.  Testicles are located scrotally bilaterally. No masses are appreciated in the testicles. Left and right epididymis are normal. Rectal: Patient with  normal sphincter tone. Anus and perineum without scarring or rashes. No rectal masses are appreciated. Prostate is approximately *** grams, *** nodules are appreciated. Seminal vesicles are normal.  Skin: No rashes, bruises or suspicious lesions. Lymph: No cervical or inguinal adenopathy. Neurologic: Grossly intact, no focal deficits, moving all 4 extremities. Psychiatric: Normal mood and affect.  Laboratory Data: Lab Results  Component Value Date   CREATININE 0.94 06/10/2023      Component Value Date/Time   CHOL 156 06/10/2023 0236   HDL 42 06/10/2023 0236   CHOLHDL 3.7 06/10/2023 0236   LDLCALC 98 06/10/2023 0236   Lab Results  Component Value Date    AST 19 06/10/2023   Lab Results  Component Value Date   ALT 14 06/10/2023   Urinalysis See EPIC and HPI *** I have reviewed the labs.   Pertinent Imaging: N/A  Assessment & Plan:  ***  1. Erectile dysfunction - I explained to the patient that in order to achieve an erection it takes good functioning of the nervous system (parasympathetic and rs, sympathetic, sensory and motor), good blood flow into the erectile tissue of the penis and a desire to have sex - I explained that conditions like diabetes, hypertension, coronary artery disease, peripheral vascular disease, smoking, alcohol consumption, age, sleep apnea and BPH can diminish the ability to have an erection - I explained the ED may be a risk marker for underlying CVD and he should follow up with PCP for further studies *** - we will obtain a serum testosterone level at this time; if it is abnormal we will need to repeat the study for confirmation *** - A recent study published in Sex Med 2018 Apr 13 revealed moderate to vigorous aerobic exercise for 40 minutes 4 times per week can decrease erectile problems caused by physical inactivity, obesity, hypertension, metabolic syndrome and/or cardiovascular diseases *** - We discussed trying a *** different PDE5 inhibitor, intra-urethral suppositories, intracavernous vasoactive drug injection therapy, vacuum erection devices, LI-ESWT and penile prosthesis implantation  -encouraged smoking cessation and marijuana cessation  No follow-ups on file.  These notes generated with voice recognition software. I apologize for typographical errors.  CLOTILDA HELON RIGGERS  [facility] [address]  [address] [place], [place] [address] [phone]

## 2023-12-05 ENCOUNTER — Ambulatory Visit: Payer: Self-pay | Admitting: Urology

## 2023-12-05 DIAGNOSIS — N529 Male erectile dysfunction, unspecified: Secondary | ICD-10-CM

## 2023-12-18 ENCOUNTER — Other Ambulatory Visit: Payer: Self-pay

## 2023-12-22 ENCOUNTER — Other Ambulatory Visit: Payer: Self-pay

## 2023-12-24 ENCOUNTER — Other Ambulatory Visit: Payer: Self-pay

## 2024-01-15 ENCOUNTER — Other Ambulatory Visit: Payer: Self-pay

## 2024-01-19 ENCOUNTER — Other Ambulatory Visit: Payer: Self-pay

## 2024-01-28 ENCOUNTER — Ambulatory Visit: Payer: Self-pay | Admitting: Pharmacist

## 2024-01-30 ENCOUNTER — Other Ambulatory Visit: Payer: Self-pay

## 2024-01-30 ENCOUNTER — Other Ambulatory Visit (HOSPITAL_COMMUNITY): Payer: Self-pay

## 2024-02-04 ENCOUNTER — Ambulatory Visit: Payer: Self-pay | Admitting: Pharmacist

## 2024-02-06 ENCOUNTER — Other Ambulatory Visit: Payer: Self-pay

## 2024-03-02 ENCOUNTER — Other Ambulatory Visit: Payer: Self-pay

## 2024-03-05 ENCOUNTER — Other Ambulatory Visit: Payer: Self-pay

## 2024-03-05 ENCOUNTER — Other Ambulatory Visit: Payer: Self-pay | Admitting: Pharmacy Technician

## 2024-03-05 NOTE — Progress Notes (Signed)
 Specialty Pharmacy Refill Coordination Note  Brendan Miller is a 23 y.o. male contacted today regarding refills of specialty medication(s) Emtricitabine -Tenofovir  AF (Descovy )   Patient requested Delivery   Delivery date: 03/08/24   Verified address: [address] [place], [place] [address]   Medication will be filled on 03/05/24.

## 2024-03-24 ENCOUNTER — Other Ambulatory Visit: Payer: Self-pay

## 2024-03-29 ENCOUNTER — Other Ambulatory Visit: Payer: Self-pay

## 2024-03-30 NOTE — Progress Notes (Deleted)
   HPI: Brendan Miller is a 23 y.o. male who presents to the [facility] clinic today for STI testing. Patient is also seen by clinic for PrEP (on Descovy )  There are no active problems to display for this patient.   Patient's Medications  New Prescriptions   No medications on file  Previous Medications   CLOTRIMAZOLE -BETAMETHASONE  (LOTRISONE ) CREAM    APPLY TOPICALLY TWO TIMES DAILY   CLOTRIMAZOLE -BETAMETHASONE  (LOTRISONE ) CREAM    Apply 1 Application topically daily.   EMTRICITABINE -TENOFOVIR  AF (DESCOVY ) 200-25 MG TABLET    Take 1 tablet by mouth daily.   TADALAFIL  (CIALIS ) 5 MG TABLET    Take 0.5 tablets (2.5 mg total) by mouth daily.   TERBINAFINE  (LAMISIL ) 250 MG TABLET    Take 1 tablet (250 mg total) by mouth daily.   TERBINAFINE  (LAMISIL ) 250 MG TABLET    Take 1 tablet (250 mg total) by mouth daily.  Modified Medications   No medications on file  Discontinued Medications   No medications on file    Assessment: ***  Plan: - STI screening: HIV antibody, RPR, urine/rectal/pharyngeal GC/CT swabs for cytology today - F/u results to see if treatment is needed  Tolu Olanda Downie, PharmD [employer] PGY-1

## 2024-03-31 ENCOUNTER — Other Ambulatory Visit: Payer: Self-pay

## 2024-03-31 ENCOUNTER — Ambulatory Visit: Payer: Self-pay | Admitting: Pharmacist

## 2024-04-02 ENCOUNTER — Ambulatory Visit: Payer: Self-pay | Admitting: Pharmacist

## 2024-04-02 ENCOUNTER — Other Ambulatory Visit: Payer: Self-pay

## 2024-04-02 ENCOUNTER — Other Ambulatory Visit (HOSPITAL_COMMUNITY): Payer: Self-pay

## 2024-04-02 DIAGNOSIS — Z113 Encounter for screening for infections with a predominantly sexual mode of transmission: Secondary | ICD-10-CM

## 2024-04-02 DIAGNOSIS — Z2981 Encounter for HIV pre-exposure prophylaxis: Secondary | ICD-10-CM

## 2024-04-02 MED ORDER — DOXYCYCLINE HYCLATE 100 MG PO TABS
ORAL_TABLET | ORAL | 0 refills | Status: AC
  Filled 2024-04-02: qty 60, 30d supply, fill #0

## 2024-04-02 MED ORDER — DOXYCYCLINE HYCLATE 100 MG PO TABS
ORAL_TABLET | ORAL | 0 refills | Status: DC
  Filled 2024-04-02: qty 60, 30d supply, fill #0

## 2024-04-02 NOTE — Progress Notes (Signed)
 HPI: Brendan Miller is a 23 y.o. male who presents to the [facility] clinic today for STI testing. Patient is also seen by clinic for PrEP (on Descovy ).   There are no active problems to display for this patient.   Patient's Medications  New Prescriptions   No medications on file  Previous Medications   CLOTRIMAZOLE -BETAMETHASONE  (LOTRISONE ) CREAM    APPLY TOPICALLY TWO TIMES DAILY   CLOTRIMAZOLE -BETAMETHASONE  (LOTRISONE ) CREAM    Apply 1 Application topically daily.   EMTRICITABINE -TENOFOVIR  AF (DESCOVY ) 200-25 MG TABLET    Take 1 tablet by mouth daily.   TADALAFIL  (CIALIS ) 5 MG TABLET    Take 0.5 tablets (2.5 mg total) by mouth daily.   TERBINAFINE  (LAMISIL ) 250 MG TABLET    Take 1 tablet (250 mg total) by mouth daily.   TERBINAFINE  (LAMISIL ) 250 MG TABLET    Take 1 tablet (250 mg total) by mouth daily.  Modified Medications   No medications on file  Discontinued Medications   No medications on file    Assessment: Brendan Miller presents to clinic today for PrEP follow-up and STI testing. States he is experiencing slight burning upon urination intermittently after active with new sexual partner. Will check full STI testing today and follow-up on results as needed. He would like to defer empiric treatment today. Also discussed doxyPEP. Reviewed that this is one tool we have to prevent STIs alongside other tools such as condom use. Counseled to take TWO doxycycline  tablets within 72 hours but ideally within 24 hours of condomless sex. Educated patient on the importance of taking with a full meal to prevent stomach upset and sitting up for at least one hour following administration. Patient verbalized understanding. Will send prescription to [facility] for delivery. Discussed that he will pay out of pocket for this given he is uninsured.  Brendan Miller last followed with Cassie in January for PrEP management. He states he "was away from his medication" for ~1 month; did not expand upon when questioned. States he  still has two full Descovy  bottles at home and does not need refills right now. Besides this one-month period off of medication, he denies missed doses. States that he experiences stomach upset and nausea for ~1 week when restarting the medication. Will check HIV antibody test today. Denies any symptoms of acute viral syndrome such as lymphadenopathy, diarrhea, flu-like illness, weakness, or fatigue. Will not sent additional refills until he requests.   Unfortunately, [facility] [employer] is pulling the funding for our uninsured HIV PrEP program in 2026. Discussed this with him. Advised that he must either sign up for insurance through the marketplace, sign up for Medicaid if eligible, or apply for [facility] charity care (financial assistance) soon. Discussed that the main costs would be the $65 office visit copay and the lab work. Advised that I will only need an HIV test before refilling his Descovy  and that there are free STI testing options in the community. [facility] does provide financial assistance for uninsured patients for most lab work (HIV tests and RPR) but that the cytologies (STI testing) would need to be covered through the [facility] charity care. The PrEP medication (both oral and injectable) would be covered through patient assistance through the drug manufacturer still or through their insurance if they decide to sign up. Also reviewed option for continuing oral PrEP care through [facility]. He acknowledged these options and deferred meeting with Medicaid today.   Plan: - Check HIV antibody, RPR, and urine/rectal/pharyngeal GC/CT swabs for cytology today -  Continue Descovy  daily (will reach out for refills when needed)  - F/u results to see if treatment is needed  Nicklas Barns, PharmD, CPP, BCIDP, AAHIVP Clinical Pharmacist Practitioner Infectious Diseases Clinical Pharmacist [facility]

## 2024-04-03 LAB — C. TRACHOMATIS/N. GONORRHOEAE RNA
C. trachomatis RNA, TMA: NOT DETECTED
N. gonorrhoeae RNA, TMA: NOT DETECTED

## 2024-04-03 LAB — CT/NG RNA, TMA RECTAL
Chlamydia Trachomatis RNA: NOT DETECTED
Neisseria Gonorrhoeae RNA: DETECTED — AB

## 2024-04-03 LAB — HIV ANTIBODY (ROUTINE TESTING W REFLEX): HIV 1&2 Ab, 4th Generation: NONREACTIVE

## 2024-04-03 LAB — GC/CHLAMYDIA PROBE, AMP (THROAT)
Chlamydia trachomatis RNA: NOT DETECTED
Neisseria gonorrhoeae RNA: NOT DETECTED

## 2024-04-03 LAB — RPR: RPR Ser Ql: NONREACTIVE

## 2024-04-05 ENCOUNTER — Ambulatory Visit: Payer: Self-pay | Admitting: Pharmacist

## 2024-04-05 ENCOUNTER — Other Ambulatory Visit (HOSPITAL_COMMUNITY): Payer: Self-pay

## 2024-04-06 NOTE — Progress Notes (Deleted)
   HPI: Brendan Miller is a 23 y.o. male who presents to the [facility] clinic today for STI testing.  There are no active problems to display for this patient.   Patient's Medications  New Prescriptions   No medications on file  Previous Medications   CLOTRIMAZOLE -BETAMETHASONE  (LOTRISONE ) CREAM    APPLY TOPICALLY TWO TIMES DAILY   CLOTRIMAZOLE -BETAMETHASONE  (LOTRISONE ) CREAM    Apply 1 Application topically daily.   DOXYCYCLINE  (VIBRA -TABS) 100 MG TABLET    Take 2 tablets (200 mg) by mouth within 24-72 hours after condomless sex. If you have sex again within 24 hours of taking doxycycline , take another dose 24 hours after your last dose.   EMTRICITABINE -TENOFOVIR  AF (DESCOVY ) 200-25 MG TABLET    Take 1 tablet by mouth daily.   TADALAFIL  (CIALIS ) 5 MG TABLET    Take 0.5 tablets (2.5 mg total) by mouth daily.   TERBINAFINE  (LAMISIL ) 250 MG TABLET    Take 1 tablet (250 mg total) by mouth daily.   TERBINAFINE  (LAMISIL ) 250 MG TABLET    Take 1 tablet (250 mg total) by mouth daily.  Modified Medications   No medications on file  Discontinued Medications   No medications on file    Assessment: Tanis presents today for the treatment of rectal gonorrhea. He follows the [facility] pharmacy team for HIV PrEP, and tested positive for gonorrhea on 04/02/24. He reports ***.  Plan: - Ceftriaxone  500 mg IM x 1 administered in clinic - Follow up with any questions or concerns - Next PrEP visit with Cassie Kuppelweiser on 06/29/24  Estela Held, PharmD PGY-2 Infectious Diseases Pharmacy Resident [facility] 04/06/2024 3:21 PM

## 2024-04-06 NOTE — Progress Notes (Unsigned)
   HPI: Brendan Miller is a 23 y.o. male who presents to the [facility] clinic today for CTX administration   There are no active problems to display for this patient.   Patient's Medications  New Prescriptions   No medications on file  Previous Medications   CLOTRIMAZOLE -BETAMETHASONE  (LOTRISONE ) CREAM    APPLY TOPICALLY TWO TIMES DAILY   CLOTRIMAZOLE -BETAMETHASONE  (LOTRISONE ) CREAM    Apply 1 Application topically daily.   DOXYCYCLINE  (VIBRA -TABS) 100 MG TABLET    Take 2 tablets (200 mg) by mouth within 24-72 hours after condomless sex. If you have sex again within 24 hours of taking doxycycline , take another dose 24 hours after your last dose.   EMTRICITABINE -TENOFOVIR  AF (DESCOVY ) 200-25 MG TABLET    Take 1 tablet by mouth daily.   TADALAFIL  (CIALIS ) 5 MG TABLET    Take 0.5 tablets (2.5 mg total) by mouth daily.   TERBINAFINE  (LAMISIL ) 250 MG TABLET    Take 1 tablet (250 mg total) by mouth daily.   TERBINAFINE  (LAMISIL ) 250 MG TABLET    Take 1 tablet (250 mg total) by mouth daily.  Modified Medications   No medications on file  Discontinued Medications   No medications on file    Assessment: Patient was last seen in clinic on 06/06 for PrEP follow up and STI testing. At this visit he reported a burning sensation during urination. His symptoms started after an encounter with his new sex partner. STI screening and HIV antibody was collected. HIV testing was negative. Rectal swab was positive for gonorrhea. He was recommended to return to clinic for treatment.   Today patient reports he is still experiencing a burning sensation during urination. This sensation occurs more often in the morning, but tend to improve through the day. He also noted some rectal bleeding ~2 weeks ago. Otherwise he has remained well.   Recommend CTX 500 mg IM injection for treatment of gonorrhea.   Plan: - Given CTX 500 mg IM x once in LEFT glute  -Will continue to follow clinic for PrEP -Will contact the  office if urinary symptoms worsen or new symptoms develop -Patient encouraged to increase fluid intake  Tolu Vencent Hauschild, PharmD [employer] PGY-1

## 2024-04-07 ENCOUNTER — Other Ambulatory Visit: Payer: Self-pay

## 2024-04-07 ENCOUNTER — Ambulatory Visit (INDEPENDENT_AMBULATORY_CARE_PROVIDER_SITE_OTHER): Payer: Self-pay | Admitting: Pharmacist

## 2024-04-07 DIAGNOSIS — Z2981 Encounter for HIV pre-exposure prophylaxis: Secondary | ICD-10-CM

## 2024-04-07 DIAGNOSIS — A549 Gonococcal infection, unspecified: Secondary | ICD-10-CM

## 2024-04-07 MED ORDER — CEFTRIAXONE SODIUM 500 MG IJ SOLR
500.0000 mg | Freq: Once | INTRAMUSCULAR | Status: AC
  Administered 2024-04-07: 500 mg via INTRAMUSCULAR

## 2024-04-27 ENCOUNTER — Other Ambulatory Visit: Payer: Self-pay | Admitting: Podiatry

## 2024-05-10 ENCOUNTER — Other Ambulatory Visit: Payer: Self-pay

## 2024-06-24 NOTE — Progress Notes (Deleted)
 HPI: Brendan Miller is a 23 y.o. male who presents to the [facility] clinic for HIV PrEP follow-up.  Insured   []    Uninsured  []    There are no active problems to display for this patient.   Patient's Medications  New Prescriptions   No medications on file  Previous Medications   CLOTRIMAZOLE -BETAMETHASONE  (LOTRISONE ) CREAM    APPLY TOPICALLY TWO TIMES DAILY   CLOTRIMAZOLE -BETAMETHASONE  (LOTRISONE ) CREAM    APPLY 1 APPLICATION TOPICALLY DAILY   DOXYCYCLINE  (VIBRA -TABS) 100 MG TABLET    Take 2 tablets (200 mg) by mouth within 24-72 hours after condomless sex. If you have sex again within 24 hours of taking doxycycline , take another dose 24 hours after your last dose.   EMTRICITABINE -TENOFOVIR  AF (DESCOVY ) 200-25 MG TABLET    Take 1 tablet by mouth daily.   TADALAFIL  (CIALIS ) 5 MG TABLET    Take 0.5 tablets (2.5 mg total) by mouth daily.   TERBINAFINE  (LAMISIL ) 250 MG TABLET    Take 1 tablet (250 mg total) by mouth daily.   TERBINAFINE  (LAMISIL ) 250 MG TABLET    Take 1 tablet (250 mg total) by mouth daily.  Modified Medications   No medications on file  Discontinued Medications   No medications on file       06/10/2023    2:08 PM  [facility] HIV PREP FLOWSHEET RESULTS  Insurance Status Uninsured  Gender at birth Male  Gender identity cis-Male  Risk for HIV Condomless vaginal or anal intercourse;Hx of STI  Sex Partners Both men and women  # sex partners past 3-6 mos 2  Sex activity preferences Insertive;Oral  Condom use Yes  % condom use 50  Treated for STI? Yes  HIV symptoms? None  PrEP Eligibility Yes    Labs:  SCr: Lab Results  Component Value Date   CREATININE 0.94 06/10/2023   CREATININE 0.84 08/24/2014   CREATININE 0.86 12/09/2013   CREATININE 0.75 10/07/2011   CREATININE 0.7 01/23/2010   HIV Lab Results  Component Value Date   HIV NON-REACTIVE 04/02/2024   HIV NON-REACTIVE 11/04/2023   HIV NON-REACTIVE 06/10/2023   HIV Non Reactive 05/13/2022   HIV  Non Reactive 02/11/2022   Hepatitis B Lab Results  Component Value Date   HEPBSAB REACTIVE (A) 06/10/2023   HEPBSAG NON-REACTIVE 06/10/2023   Hepatitis C Lab Results  Component Value Date   HEPCAB NON-REACTIVE 06/10/2023   Hepatitis A Lab Results  Component Value Date   HAV REACTIVE (A) 06/10/2023   RPR and STI Lab Results  Component Value Date   LABRPR NON-REACTIVE 04/02/2024   LABRPR NON-REACTIVE 11/04/2023   LABRPR NON-REACTIVE 06/10/2023   LABRPR Non Reactive 05/13/2022   LABRPR Non Reactive 02/11/2022    STI Results GC GC CT CT  11/04/2023  3:10 PM Negative    Negative    Negative   Negative    Negative    Negative    06/10/2023  2:12 PM Negative    Negative   Negative    Negative    05/13/2022 10:42 AM Positive   Positive    02/11/2022 12:02 PM Negative   Negative    01/06/2019  1:06 PM   Negative    04/21/2018 11:51 AM   Negative    05/13/2017 11:27 AM   Negative    04/29/2017  4:00 PM   Negative    03/20/2016 11:23 AM  NOT DETECTED   NOT DETECTED     Assessment: Brendan Miller is here  today to follow up for HIV PrEP. He takes Descovy  ***. He was last seen by Alan on 04/02/24 where his rectal screening was positive for gonorrhea. He received IM ceftriaxone  on 04/07/24 and tolerated it well. He is having *** today. He has not received a refill since May. I am going to assume that he is not taking it every day even though he says that he is. He should have run out of medication several months ago.     Discussed that we are losing funding again with him Brendan Miller extensively counseled him on this back in June). We will not be able to cover office visits and labs for his next appointment in December. Advised that he should meet with our financial counselor, Cathryne, today to review options and potentially apply for Medicaid. He *** .   Plan: ***  Nai Dasch L. Ely Ballen, PharmD, BCIDP, AAHIVP, CPP Clinical Pharmacist Practitioner - Infectious Diseases Clinical Pharmacist  Lead - Specialty Pharmacy [facility]

## 2024-06-29 ENCOUNTER — Ambulatory Visit: Payer: Self-pay | Admitting: Pharmacist

## 2024-06-29 DIAGNOSIS — Z113 Encounter for screening for infections with a predominantly sexual mode of transmission: Secondary | ICD-10-CM

## 2024-06-29 DIAGNOSIS — Z2981 Encounter for HIV pre-exposure prophylaxis: Secondary | ICD-10-CM

## 2024-06-29 NOTE — Progress Notes (Deleted)
 HPI: Brendan Miller is a 23 y.o. male who presents to the [facility] pharmacy clinic for HIV PrEP follow-up.  Insured   []    Uninsured  []    There are no active problems to display for this patient.   Patient's Medications  New Prescriptions   No medications on file  Previous Medications   CLOTRIMAZOLE -BETAMETHASONE  (LOTRISONE ) CREAM    APPLY TOPICALLY TWO TIMES DAILY   CLOTRIMAZOLE -BETAMETHASONE  (LOTRISONE ) CREAM    APPLY 1 APPLICATION TOPICALLY DAILY   DOXYCYCLINE  (VIBRA -TABS) 100 MG TABLET    Take 2 tablets (200 mg) by mouth within 24-72 hours after condomless sex. If you have sex again within 24 hours of taking doxycycline , take another dose 24 hours after your last dose.   EMTRICITABINE -TENOFOVIR  AF (DESCOVY ) 200-25 MG TABLET    Take 1 tablet by mouth daily.   TADALAFIL  (CIALIS ) 5 MG TABLET    Take 0.5 tablets (2.5 mg total) by mouth daily.   TERBINAFINE  (LAMISIL ) 250 MG TABLET    Take 1 tablet (250 mg total) by mouth daily.   TERBINAFINE  (LAMISIL ) 250 MG TABLET    Take 1 tablet (250 mg total) by mouth daily.  Modified Medications   No medications on file  Discontinued Medications   No medications on file       06/10/2023    2:08 PM  [facility] HIV PREP FLOWSHEET RESULTS  Insurance Status Uninsured  Gender at birth Male  Gender identity cis-Male  Risk for HIV Condomless vaginal or anal intercourse;Hx of STI  Sex Partners Both men and women  # sex partners past 3-6 mos 2  Sex activity preferences Insertive;Oral  Condom use Yes  % condom use 50  Treated for STI? Yes  HIV symptoms? None  PrEP Eligibility Yes    Labs:  SCr: Lab Results  Component Value Date   CREATININE 0.94 06/10/2023   CREATININE 0.84 08/24/2014   CREATININE 0.86 12/09/2013   CREATININE 0.75 10/07/2011   CREATININE 0.7 01/23/2010   HIV Lab Results  Component Value Date   HIV NON-REACTIVE 04/02/2024   HIV NON-REACTIVE 11/04/2023   HIV NON-REACTIVE 06/10/2023   HIV Non Reactive 05/13/2022   HIV  Non Reactive 02/11/2022   Hepatitis B Lab Results  Component Value Date   HEPBSAB REACTIVE (A) 06/10/2023   HEPBSAG NON-REACTIVE 06/10/2023   Hepatitis C Lab Results  Component Value Date   HEPCAB NON-REACTIVE 06/10/2023   Hepatitis A Lab Results  Component Value Date   HAV REACTIVE (A) 06/10/2023   RPR and STI Lab Results  Component Value Date   LABRPR NON-REACTIVE 04/02/2024   LABRPR NON-REACTIVE 11/04/2023   LABRPR NON-REACTIVE 06/10/2023   LABRPR Non Reactive 05/13/2022   LABRPR Non Reactive 02/11/2022    STI Results GC GC CT CT  11/04/2023  3:10 PM Negative    Negative    Negative   Negative    Negative    Negative    06/10/2023  2:12 PM Negative    Negative   Negative    Negative    05/13/2022 10:42 AM Positive   Positive    02/11/2022 12:02 PM Negative   Negative    01/06/2019  1:06 PM   Negative    04/21/2018 11:51 AM   Negative    05/13/2017 11:27 AM   Negative    04/29/2017  4:00 PM   Negative    03/20/2016 11:23 AM  NOT DETECTED   NOT DETECTED     Assessment: Brendan Miller is here  today to follow up for HIV PrEP. He takes Descovy  ***. He was last seen by Brendan Miller on 04/02/24 where his rectal screening was positive for gonorrhea. He received IM ceftriaxone  on 04/07/24 and tolerated it well. He is having *** today. He has not received a refill since May. I am going to assume that he is not taking it every day even though he says that he is. He should have run out of medication several months ago.     Discussed that we are losing funding again with him Brendan Miller extensively counseled him on this back in June). We will not be able to cover office visits and labs for his next appointment in December. Advised that he should meet with our financial counselor, Brendan Miller, today to review options and potentially apply for Medicaid. He *** .   Plan: - HIV ab, RPR, and urine/rectal/pharyngeal cytologies for GC/chlamydia  - Descovy  x 3 months if HIV negative - Follow up on  ***  Brendan Miller L. Brendan Miller, PharmD, BCIDP, AAHIVP, CPP Clinical Pharmacist Practitioner - Infectious Diseases Clinical Pharmacist Lead - Specialty Pharmacy [facility]

## 2024-06-30 ENCOUNTER — Ambulatory Visit: Payer: Self-pay | Admitting: Pharmacist

## 2024-06-30 DIAGNOSIS — Z113 Encounter for screening for infections with a predominantly sexual mode of transmission: Secondary | ICD-10-CM

## 2024-06-30 DIAGNOSIS — Z2981 Encounter for HIV pre-exposure prophylaxis: Secondary | ICD-10-CM

## 2024-07-06 ENCOUNTER — Ambulatory Visit: Payer: Self-pay | Admitting: Pharmacist

## 2024-07-13 NOTE — Progress Notes (Unsigned)
 HPI: Brendan Miller is a 23 y.o. male who presents to the [facility] pharmacy clinic for HIV PrEP follow-up.  Insured   []    Uninsured  [x]    There are no active problems to display for this patient.   Patient's Medications  New Prescriptions   No medications on file  Previous Medications   CLOTRIMAZOLE -BETAMETHASONE  (LOTRISONE ) CREAM    APPLY TOPICALLY TWO TIMES DAILY   CLOTRIMAZOLE -BETAMETHASONE  (LOTRISONE ) CREAM    APPLY 1 APPLICATION TOPICALLY DAILY   DOXYCYCLINE  (VIBRA -TABS) 100 MG TABLET    Take 2 tablets (200 mg) by mouth within 24-72 hours after condomless sex. If you have sex again within 24 hours of taking doxycycline , take another dose 24 hours after your last dose.   EMTRICITABINE -TENOFOVIR  AF (DESCOVY ) 200-25 MG TABLET    Take 1 tablet by mouth daily.   TADALAFIL  (CIALIS ) 5 MG TABLET    Take 0.5 tablets (2.5 mg total) by mouth daily.   TERBINAFINE  (LAMISIL ) 250 MG TABLET    Take 1 tablet (250 mg total) by mouth daily.   TERBINAFINE  (LAMISIL ) 250 MG TABLET    Take 1 tablet (250 mg total) by mouth daily.  Modified Medications   No medications on file  Discontinued Medications   No medications on file       06/10/2023    2:08 PM  [facility] HIV PREP FLOWSHEET RESULTS  Insurance Status Uninsured  Gender at birth Male  Gender identity cis-Male  Risk for HIV Condomless vaginal or anal intercourse;Hx of STI  Sex Partners Both men and women  # sex partners past 3-6 mos 2  Sex activity preferences Insertive;Oral  Condom use Yes  % condom use 50  Treated for STI? Yes  HIV symptoms? None  PrEP Eligibility Yes    Labs:  SCr: Lab Results  Component Value Date   CREATININE 0.94 06/10/2023   CREATININE 0.84 08/24/2014   CREATININE 0.86 12/09/2013   CREATININE 0.75 10/07/2011   CREATININE 0.7 01/23/2010   HIV Lab Results  Component Value Date   HIV NON-REACTIVE 04/02/2024   HIV NON-REACTIVE 11/04/2023   HIV NON-REACTIVE 06/10/2023   HIV Non Reactive 05/13/2022   HIV  Non Reactive 02/11/2022   Hepatitis B Lab Results  Component Value Date   HEPBSAB REACTIVE (A) 06/10/2023   HEPBSAG NON-REACTIVE 06/10/2023   Hepatitis C Lab Results  Component Value Date   HEPCAB NON-REACTIVE 06/10/2023   Hepatitis A Lab Results  Component Value Date   HAV REACTIVE (A) 06/10/2023   RPR and STI Lab Results  Component Value Date   LABRPR NON-REACTIVE 04/02/2024   LABRPR NON-REACTIVE 11/04/2023   LABRPR NON-REACTIVE 06/10/2023   LABRPR Non Reactive 05/13/2022   LABRPR Non Reactive 02/11/2022    STI Results GC GC CT CT  11/04/2023  3:10 PM Negative    Negative    Negative   Negative    Negative    Negative    06/10/2023  2:12 PM Negative    Negative   Negative    Negative    05/13/2022 10:42 AM Positive   Positive    02/11/2022 12:02 PM Negative   Negative    01/06/2019  1:06 PM   Negative    04/21/2018 11:51 AM   Negative    05/13/2017 11:27 AM   Negative    04/29/2017  4:00 PM   Negative    03/20/2016 11:23 AM  NOT DETECTED   NOT DETECTED     Assessment: Brendan Miller is here  today to follow up for HIV PrEP. He was previously taking Descovy  but has been off of therapy for 2-3 months; this is reflected in his refill history with a last fill on 03/05/24 for a 30 day supply. Initially, he denied being off of therapy due to having a surplus but with further questioning stated that he indeed had been off of therapy for a while now. When asked about barriers to taking his medication he stated that he didn't have any. He was last seen by Alan on 04/02/24 where his rectal screening was positive for gonorrhea. He received IM ceftriaxone  on 04/07/24 and tolerated it well. He denies any symptoms of STIs and was updated testing today.   Discussed that we are losing funding again with him Brendan Miller extensively counseled him on this back in June). We will not be able to cover office visits and labs for his next appointment in December. Advised that he should meet with our  financial counselor Brendan Miller today to review options and potentially apply for Medicaid. He declines meeting with a Artist today.  Due for influenza vaccination; however, due to loss of funding will not be able to provide today. Recommended to go to a community pharmacy for vaccination.   Plan: HIV Ab, BMP, and lipid panel today  RPR and urine/rectal/pharyngeal cytologies for GC/chlamydia  Descovy  x 3 months if HIV negative Follow up on 10/13/24  Woodie Jock, PharmD PGY1 Pharmacy Resident  07/14/2024

## 2024-07-14 ENCOUNTER — Other Ambulatory Visit: Payer: Self-pay

## 2024-07-14 ENCOUNTER — Ambulatory Visit (INDEPENDENT_AMBULATORY_CARE_PROVIDER_SITE_OTHER): Payer: Self-pay | Admitting: Pharmacist

## 2024-07-14 DIAGNOSIS — Z113 Encounter for screening for infections with a predominantly sexual mode of transmission: Secondary | ICD-10-CM

## 2024-07-14 DIAGNOSIS — Z2981 Encounter for HIV pre-exposure prophylaxis: Secondary | ICD-10-CM

## 2024-07-15 ENCOUNTER — Telehealth: Payer: Self-pay

## 2024-07-15 ENCOUNTER — Other Ambulatory Visit: Payer: Self-pay

## 2024-07-15 ENCOUNTER — Ambulatory Visit: Payer: Self-pay

## 2024-07-15 ENCOUNTER — Other Ambulatory Visit (HOSPITAL_COMMUNITY): Payer: Self-pay

## 2024-07-15 DIAGNOSIS — Z2981 Encounter for HIV pre-exposure prophylaxis: Secondary | ICD-10-CM

## 2024-07-15 LAB — BASIC METABOLIC PANEL WITH GFR
BUN: 12 mg/dL (ref 7–25)
CO2: 31 mmol/L (ref 20–32)
Calcium: 9.7 mg/dL (ref 8.6–10.3)
Chloride: 101 mmol/L (ref 98–110)
Creat: 1.01 mg/dL (ref 0.60–1.24)
Glucose, Bld: 90 mg/dL (ref 65–99)
Potassium: 3.8 mmol/L (ref 3.5–5.3)
Sodium: 138 mmol/L (ref 135–146)
eGFR: 106 mL/min/1.73m2 (ref >=60)

## 2024-07-15 LAB — LIPID PANEL
Cholesterol: 150 mg/dL (ref <200)
HDL: 54 mg/dL (ref >=40)
LDL Cholesterol (Calc): 84 mg/dL
Non-HDL Cholesterol (Calc): 96 mg/dL (ref <130)
Total CHOL/HDL Ratio: 2.8 (calc) (ref <5.0)
Triglycerides: 43 mg/dL (ref <150)

## 2024-07-15 LAB — CT/NG RNA, TMA RECTAL
Chlamydia Trachomatis RNA: NOT DETECTED
Neisseria Gonorrhoeae RNA: NOT DETECTED

## 2024-07-15 LAB — C. TRACHOMATIS/N. GONORRHOEAE RNA
C. trachomatis RNA, TMA: NOT DETECTED
N. gonorrhoeae RNA, TMA: NOT DETECTED

## 2024-07-15 LAB — HIV ANTIBODY (ROUTINE TESTING W REFLEX)
HIV 1&2 Ab, 4th Generation: NONREACTIVE
HIV FINAL INTERPRETATION: NEGATIVE

## 2024-07-15 LAB — GC/CHLAMYDIA PROBE, AMP (THROAT)
Chlamydia trachomatis RNA: NOT DETECTED
Neisseria gonorrhoeae RNA: NOT DETECTED

## 2024-07-15 LAB — RPR: RPR Ser Ql: NONREACTIVE

## 2024-07-15 MED ORDER — DESCOVY 200-25 MG PO TABS: 1.0000 | ORAL_TABLET | Freq: Every day | ORAL | 2 refills | Status: AC

## 2024-07-15 NOTE — Telephone Encounter (Addendum)
 [facility] Patient Advocate Encounter   Patient has been approved for [employer] Advancing Access Patient Assistance Program for Descovy  from 07/15/24 to 07/15/25.  This assistance will make the patient's copay 0.00.  Your patient has been approved for Mail order delivery.  Prescription will need to be faxed to [facility] @ [phone]  Medication will be shipped to the patient home and patient will need to answer the phone to be able to get his medication delivered.  Patient knows to call the office with questions or concerns.  Arland Hutchinson, CPhT Specialty Pharmacy Patient [facility] Phone: [phone] Fax: [phone] 07/15/2024 12:59 PM

## 2024-07-15 NOTE — Addendum Note (Signed)
 Addended by: WADDELL ALAN PARAS on: 07/15/2024 01:44 PM   Modules accepted: Orders

## 2024-07-16 ENCOUNTER — Encounter: Payer: Self-pay | Admitting: *Deleted

## 2024-07-28 ENCOUNTER — Other Ambulatory Visit: Payer: Self-pay

## 2024-07-28 NOTE — Progress Notes (Signed)
 Per note from Arland 07/15/24 patient is now enrolled in patient assistance with Advancing Access [facility]). Patient will now receive their medication from [facility] and alll refills and refill request will be handled through them. Dis enrolling from [facility] due to that.

## 2024-08-10 ENCOUNTER — Telehealth: Payer: Self-pay

## 2024-08-10 NOTE — Telephone Encounter (Addendum)
 Received call from patient requesting an appointment for rectal bleeding. Reports he has hemorrhoids and that they've gotten smaller, but that the bleeding has not stopped.   States the bleeding has been going on for one month and that it is pretty dark. Reports blood in the toilet bowl as well as when wiping. He had STI testing done recently and says that this was negative.   He denies any abdominal pain, lightheadedness, faintness, clamminess. States I feel fine.  He does not have insurance or a primary care provider. Discussed that pharmacy team would likely not be able to work this up and offered appointment with provider. Next availability not for two weeks.   He declined scheduling until hearing input from [place] or [place]. Discussed that if the bleeding worsens or if he develops symptoms such as abdominal pain, lightheadedness, clamminess, that he would need to go to the ED.   He says he went to urgent care today, but that they told him they could not see him and he would need to go to the ED.   Harriette Tovey, BSN, RN

## 2024-08-11 ENCOUNTER — Emergency Department
Admission: EM | Admit: 2024-08-11 | Discharge: 2024-08-11 | Disposition: A | Discharge status: Home / Self Care | Payer: Self-pay | Attending: Emergency Medicine | Admitting: Emergency Medicine

## 2024-08-11 ENCOUNTER — Other Ambulatory Visit: Payer: Self-pay

## 2024-08-11 DIAGNOSIS — K625 Hemorrhage of anus and rectum: Secondary | ICD-10-CM | POA: Insufficient documentation

## 2024-08-11 LAB — CBC
HCT: 37.3 % — ABNORMAL LOW (ref 39.0–52.0)
Hemoglobin: 12.6 g/dL — ABNORMAL LOW (ref 13.0–17.0)
MCH: 31.8 pg (ref 26.0–34.0)
MCHC: 33.8 g/dL (ref 30.0–36.0)
MCV: 94.2 fL (ref 80.0–100.0)
Platelets: 198 10*3/uL (ref 150–400)
RBC: 3.96 MIL/uL — ABNORMAL LOW (ref 4.22–5.81)
RDW: 12.5 % (ref 11.5–15.5)
WBC: 6.1 10*3/uL (ref 4.0–10.5)
nRBC: 0 % (ref 0.0–0.2)

## 2024-08-11 LAB — TYPE AND SCREEN
ABO/RH(D): O POS
Antibody Screen: NEGATIVE

## 2024-08-11 LAB — COMPREHENSIVE METABOLIC PANEL WITH GFR
ALT: 22 U/L (ref 0–44)
AST: 31 U/L (ref 15–41)
Albumin: 4.1 g/dL (ref 3.5–5.0)
Alkaline Phosphatase: 62 U/L (ref 38–126)
Anion gap: 13 (ref 5–15)
BUN: 9 mg/dL (ref 6–20)
CO2: 23 mmol/L (ref 22–32)
Calcium: 9.3 mg/dL (ref 8.9–10.3)
Chloride: 102 mmol/L (ref 98–111)
Creatinine, Ser: 0.9 mg/dL (ref 0.61–1.24)
GFR, Estimated: >60 mL/min (ref >60)
Glucose, Bld: 93 mg/dL (ref 70–99)
Potassium: 3.7 mmol/L (ref 3.5–5.1)
Sodium: 138 mmol/L (ref 135–145)
Total Bilirubin: 1 mg/dL (ref 0.0–1.2)
Total Protein: 7.9 g/dL (ref 6.5–8.1)

## 2024-08-11 MED ORDER — DOCUSATE SODIUM 100 MG PO CAPS
100.0000 mg | ORAL_CAPSULE | Freq: Two times a day (BID) | ORAL | 1 refills | Status: AC
Start: 2024-08-11 — End: 2024-10-10

## 2024-08-11 NOTE — ED Provider Notes (Signed)
 [facility] Provider Note    Event Date/Time   First MD Initiated Contact with Patient 08/11/24 1858     (approximate)  History   Chief Complaint: Rectal Bleeding  HPI  Brendan Miller is a 23 y.o. male who presents to the emergency department for intermittent rectal bleeding.  According to the patient for about a month now he has intermittently been experiencing bleeding after a bowel movement.  Patient states he believes that is happening more frequently now almost every morning when he has a bowel movement he will wipe and see blood on the toilet paper.  Patient states he knows he has a small hemorrhoid and believes the bleeding could be coming from that.  Patient also practices anal intercourse, and was concerned that could have caused it.  Physical Exam   Triage Vital Signs: ED Triage Vitals [08/11/24 1726]  Encounter Vitals Group     BP 137/73     Girls Systolic BP Percentile      Girls Diastolic BP Percentile      Boys Systolic BP Percentile      Boys Diastolic BP Percentile      Pulse Rate 90     Resp 18     Temp 98.8 F (37.1 C)     Temp Source Oral     SpO2 99 %     Weight      Height      Head Circumference      Peak Flow      Pain Score 0     Pain Loc      Pain Education      Exclude from Growth Chart     Most recent vital signs: Vitals:   08/11/24 1726  BP: 137/73  Pulse: 90  Resp: 18  Temp: 98.8 F (37.1 C)  SpO2: 99%    General: Awake, no distress.  CV:  Good peripheral perfusion. Resp:  Normal effort.   Abd:  No distention.   Other:  Patient has a very small hemorrhoid that is nonthrombosed, no active bleeding.   ED Results / Procedures / Treatments   MEDICATIONS ORDERED IN ED: Medications - No data to display   IMPRESSION / MDM / ASSESSMENT AND PLAN / ED COURSE  I reviewed the triage vital signs and the nursing notes.  Patient's presentation is most consistent with acute presentation with potential threat  to life or bodily function.  Patient presents emergency department for intermittent rectal bleeding over the past 1 month although becoming more frequent per patient.  On examination patient has a very small hemorrhoid nonthrombosed, no bleeding.  In discussing with the patient further he states the pain typically happens on initiation of a bowel movement then followed by small amount of blood on the toilet paper.  Symptoms seem much more suggestive of an anal fissure although no obvious anal fissure seen on examination.  Patient practices anal intercourse, I had a long discussion with the patient regarding placing him on a stool softener twice daily for the next 2 months, avoiding intercourse, and following up with GI medicine should his symptoms fail to resolve.  Given the patient's otherwise reassuring workup reassuring vitals I believe the patient safe for discharge home with outpatient GI follow-up as needed.  Patient is agreeable to this plan.  FINAL CLINICAL IMPRESSION(S) / ED DIAGNOSES   Rectal bleeding   Note:  This document was prepared using Dragon voice recognition software and may include unintentional dictation  errors.   Dorothyann Drivers, MD 08/11/24 1918

## 2024-08-11 NOTE — Telephone Encounter (Signed)
 Unable to reach patient by phone. Nurse advised via mychart  that the Pharmacy team recommends evaluation by a provider. Patient was encouraged to schedule an appointment for further assessment.  Brendan Kleine, LPN

## 2024-08-11 NOTE — Discharge Instructions (Addendum)
 As we discussed please take your stool softener twice daily.  If you develop diarrhea you may back down to once a day.  If you continue to have symptoms over the next 30 days please call the number provided for GI medicine to arrange a follow-up appointment.  Return to the emergency department for any abdominal pain any worsening bleeding or any other symptom concerning to yourself.

## 2024-08-11 NOTE — ED Triage Notes (Signed)
 Pt states he has hemorrhoids for about a month and also having rectal bleeding with bright red blood. Pt denies any pain with it. States it is only in the mornings.

## 2024-08-11 NOTE — Telephone Encounter (Signed)
Thank you Shaquenia 

## 2024-08-11 NOTE — Telephone Encounter (Signed)
 Patient definitely needs to be a seen by a provider or go to ED. - Brendan Miller

## 2024-09-16 ENCOUNTER — Telehealth: Payer: Self-pay

## 2024-09-16 NOTE — Telephone Encounter (Signed)
 [facility] Patient Advocate Encounter  [facility] specialty pharmacy and I have been unsuccessful in reaching patient to be able to refill medication.    We have tried multiple times without a response.  Patient will need to call [facility] pharmacy in order to get a refill. [phone] or [phone]  Charmaine Sharps, CPhT Specialty Pharmacy Patient [facility] for [facility]ious Disease Phone: [phone] Fax:  [phone]

## 2024-09-16 NOTE — Telephone Encounter (Signed)
 Thank you for the update!

## 2024-10-11 ENCOUNTER — Other Ambulatory Visit (HOSPITAL_COMMUNITY): Payer: Self-pay

## 2024-10-11 NOTE — Progress Notes (Deleted)
 Date:  10/11/2024   HPI: Brendan Miller is a 23 y.o. male who presents to the [facility] pharmacy clinic for HIV PrEP follow-up.  Insured   []    Uninsured  [x]    Referring ID Physician: Dr. Fleeta Rothman  There are no active problems to display for this patient.   Patient's Medications  New Prescriptions   No medications on file  Previous Medications   CLOTRIMAZOLE -BETAMETHASONE  (LOTRISONE ) CREAM    APPLY TOPICALLY TWO TIMES DAILY   CLOTRIMAZOLE -BETAMETHASONE  (LOTRISONE ) CREAM    APPLY 1 APPLICATION TOPICALLY DAILY   DOXYCYCLINE  (VIBRA -TABS) 100 MG TABLET    Take 2 tablets (200 mg) by mouth within 24-72 hours after condomless sex. If you have sex again within 24 hours of taking doxycycline , take another dose 24 hours after your last dose.   EMTRICITABINE -TENOFOVIR  AF (DESCOVY ) 200-25 MG TABLET    Take 1 tablet by mouth daily.   TADALAFIL  (CIALIS ) 5 MG TABLET    Take 0.5 tablets (2.5 mg total) by mouth daily.   TERBINAFINE  (LAMISIL ) 250 MG TABLET    Take 1 tablet (250 mg total) by mouth daily.   TERBINAFINE  (LAMISIL ) 250 MG TABLET    Take 1 tablet (250 mg total) by mouth daily.  Modified Medications   No medications on file  Discontinued Medications   No medications on file    Allergies: Allergies[1]  Past Medical History: Past Medical History:  Diagnosis Date   Hives     Social History: Social History   Socioeconomic History   Marital status: Single    Spouse name: Not on file   Number of children: Not on file   Years of education: Not on file   Highest education level: Not on file  Occupational History   Not on file  Tobacco Use   Smoking status: Every Day    Types: Cigarettes    Passive exposure: Current   Smokeless tobacco: Never  Vaping Use   Vaping status: Every Day  Substance and Sexual Activity   Alcohol use: No   Drug use: Yes    Types: Marijuana   Sexual activity: Yes    Partners: Female, Male  Other Topics Concern   Not on file  Social History  Narrative   Wants to graduate in Dec 2018       Wants to start a group home    Social Drivers of Health   Tobacco Use: High Risk (08/11/2024)   Patient History    Smoking Tobacco Use: Every Day    Smokeless Tobacco Use: Never    Passive Exposure: Current  Financial Resource Strain: Not on file  Food Insecurity: Not on file  Transportation Needs: Not on file  Physical Activity: Not on file  Stress: Not on file  Social Connections: Not on file  Depression (EYV7-0): Not on file  Alcohol Screen: Not on file  Housing: Not on file  Utilities: Not on file  Health Literacy: Not on file       06/10/2023    2:08 PM  CH[facility] HIV PREP FLOWSHEET RESULTS  Insurance Status Uninsured  Gender at birth Male  Gender identity cis-Male  Risk for HIV Condomless vaginal or anal intercourse;Hx of STI  Sex Partners Both men and women  # sex partners past 3-6 mos 2  Sex activity preferences Insertive;Oral  Condom use Yes  % condom use 50  Treated for STI? Yes  HIV symptoms? None  PrEP Eligibility Yes    Labs:  SCr: Lab  Results  Component Value Date   CREATININE 0.90 08/11/2024   CREATININE 1.01 07/14/2024   CREATININE 0.94 06/10/2023   CREATININE 0.84 08/24/2014   CREATININE 0.86 12/09/2013   HIV Lab Results  Component Value Date   HIV NON-REACTIVE 07/14/2024   HIV NON-REACTIVE 04/02/2024   HIV NON-REACTIVE 11/04/2023   HIV NON-REACTIVE 06/10/2023   HIV Non Reactive 05/13/2022   Hepatitis B Lab Results  Component Value Date   HEPBSAB REACTIVE (A) 06/10/2023   HEPBSAG NON-REACTIVE 06/10/2023   Hepatitis C Lab Results  Component Value Date   HEPCAB NON-REACTIVE 06/10/2023   Hepatitis A Lab Results  Component Value Date   HAV REACTIVE (A) 06/10/2023   RPR and STI Lab Results  Component Value Date   LABRPR NON-REACTIVE 07/14/2024   LABRPR NON-REACTIVE 04/02/2024   LABRPR NON-REACTIVE 11/04/2023   LABRPR NON-REACTIVE 06/10/2023   LABRPR Non Reactive 05/13/2022     STI Results GC GC CT CT  11/04/2023  3:10 PM Negative    Negative    Negative   Negative    Negative    Negative    06/10/2023  2:12 PM Negative    Negative   Negative    Negative    05/13/2022 10:42 AM Positive   Positive    02/11/2022 12:02 PM Negative   Negative    01/06/2019  1:06 PM   Negative    04/21/2018 11:51 AM   Negative    05/13/2017 11:27 AM   Negative    04/29/2017  4:00 PM   Negative    03/20/2016 11:23 AM  NOT DETECTED   NOT DETECTED     Assessment: Friend presents to clinic today for PrEP follow-up. Has been off of medication now for ~6 months as he has not resumed since his last appointment with us  in September as reflected by no fill history. States ***.    Screened patient for acute HIV symptoms such as fatigue, muscle aches, rash, sore throat, lymphadenopathy, headache, night sweats, nausea/vomiting/diarrhea, and fever.  Will check HIV antibody today. No need to send refills in as patient has not filled any that have been available since September.   Last STI screening was in September and was negative. *** new sexual partners since last visit; will check *** today.  Also reviewed loss of funding today and need to seek [facility] financial assistance or Medicaid eligibility.   Plan: - Check HIV antibody - Follow-up with *** on ***   Alan Geralds, PharmD, CPP, BCIDP, AAHIVP Clinical Pharmacist Practitioner Infectious Diseases Clinical Pharmacist [facility] 10/11/2024, 10:47 AM      [1]  Allergies Allergen Reactions   Wheat Hives

## 2024-10-13 ENCOUNTER — Ambulatory Visit: Payer: Self-pay | Admitting: Pharmacist

## 2024-10-13 DIAGNOSIS — Z2981 Encounter for HIV pre-exposure prophylaxis: Secondary | ICD-10-CM

## 2024-10-13 DIAGNOSIS — Z113 Encounter for screening for infections with a predominantly sexual mode of transmission: Secondary | ICD-10-CM

## 2024-11-02 ENCOUNTER — Telehealth: Payer: Self-pay

## 2024-11-02 NOTE — Telephone Encounter (Signed)
 Patient called wanting to know where he was referred for further PrEP. States he went to [facility], but they are not doing testing today and they told him they did not receive a referral from our office.   Reviewed that there likely was not a formal referral to [facility] since they have walk-in testing days and PrEP can be accessed online. Directed him to their website to schedule an STI testing appointment and to request info for PrEP enrollment.   Decklan Mau, BSN, RN

## 2024-11-04 ENCOUNTER — Ambulatory Visit: Payer: Self-pay
